# Patient Record
Sex: Female | Born: 1980 | Race: White | Hispanic: No | Marital: Married | Smoking: Never smoker
Health system: Southern US, Community
[De-identification: ages and names within clinical notes are randomized; demographics above are authoritative.]

## PROBLEM LIST (undated history)

## (undated) ENCOUNTER — Inpatient Hospital Stay (HOSPITAL_COMMUNITY): Payer: Self-pay

## (undated) DIAGNOSIS — Z975 Presence of (intrauterine) contraceptive device: Secondary | ICD-10-CM

## (undated) DIAGNOSIS — Z01419 Encounter for gynecological examination (general) (routine) without abnormal findings: Secondary | ICD-10-CM

## (undated) DIAGNOSIS — O3660X0 Maternal care for excessive fetal growth, unspecified trimester, not applicable or unspecified: Secondary | ICD-10-CM

## (undated) DIAGNOSIS — Z8619 Personal history of other infectious and parasitic diseases: Secondary | ICD-10-CM

## (undated) DIAGNOSIS — Z973 Presence of spectacles and contact lenses: Secondary | ICD-10-CM

## (undated) DIAGNOSIS — L52 Erythema nodosum: Secondary | ICD-10-CM

## (undated) DIAGNOSIS — T7840XA Allergy, unspecified, initial encounter: Secondary | ICD-10-CM

## (undated) DIAGNOSIS — G5711 Meralgia paresthetica, right lower limb: Secondary | ICD-10-CM

## (undated) HISTORY — DX: Presence of (intrauterine) contraceptive device: Z97.5

## (undated) HISTORY — DX: Presence of spectacles and contact lenses: Z97.3

## (undated) HISTORY — DX: Allergy, unspecified, initial encounter: T78.40XA

## (undated) HISTORY — DX: Personal history of other infectious and parasitic diseases: Z86.19

## (undated) HISTORY — PX: CYST REMOVAL HAND: SHX6279

## (undated) HISTORY — PX: WISDOM TOOTH EXTRACTION: SHX21

## (undated) HISTORY — DX: Erythema nodosum: L52

## (undated) HISTORY — DX: Meralgia paresthetica, right lower limb: G57.11

## (undated) HISTORY — DX: Encounter for gynecological examination (general) (routine) without abnormal findings: Z01.419

---
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
## 2012-08-20 DIAGNOSIS — Z975 Presence of (intrauterine) contraceptive device: Secondary | ICD-10-CM

## 2012-08-20 HISTORY — DX: Presence of (intrauterine) contraceptive device: Z97.5

## 2012-08-20 NOTE — L&D Delivery Note (Signed)
Delivery Note At 2:31 PM a viable female was delivered via Vaginal, Spontaneous Delivery (Presentation: ; Occiput Anterior).  APGAR: 9, 9; weight .   Placenta status: Intact, Spontaneous.  Cord: 3 vessels with the following complications: None.  Cord pH: not sent Loose nuchal cord X 3 Anesthesia: Epidural  Episiotomy: None Lacerations: bilat periurethral + sec deg>>>rectumk + sphincter intact   Suture Repair: 3.0 vicryl rapide Est. Blood Loss (mL): 500  Mom to postpartum.  Baby to nursery-stable.  Iowa Kappes M 06/12/2013, 3:03 PM

## 2012-10-30 LAB — OB RESULTS CONSOLE HIV ANTIBODY (ROUTINE TESTING): HIV: NONREACTIVE

## 2012-10-30 LAB — OB RESULTS CONSOLE RPR: RPR: NONREACTIVE

## 2012-10-30 LAB — OB RESULTS CONSOLE RUBELLA ANTIBODY, IGM: Rubella: IMMUNE

## 2012-10-30 LAB — OB RESULTS CONSOLE HEPATITIS B SURFACE ANTIGEN: Hepatitis B Surface Ag: NEGATIVE

## 2012-10-30 LAB — OB RESULTS CONSOLE GC/CHLAMYDIA: Chlamydia: NEGATIVE

## 2013-05-02 ENCOUNTER — Inpatient Hospital Stay (HOSPITAL_COMMUNITY)
Admission: AD | Admit: 2013-05-02 | Discharge: 2013-05-02 | Disposition: A | Discharge status: Home / Self Care | Payer: BC Managed Care – PPO | Source: Ambulatory Visit | Attending: Obstetrics and Gynecology | Admitting: Obstetrics and Gynecology

## 2013-05-02 ENCOUNTER — Encounter (HOSPITAL_COMMUNITY): Payer: Self-pay | Admitting: Family

## 2013-05-02 DIAGNOSIS — N949 Unspecified condition associated with female genital organs and menstrual cycle: Secondary | ICD-10-CM

## 2013-05-02 DIAGNOSIS — R109 Unspecified abdominal pain: Secondary | ICD-10-CM | POA: Insufficient documentation

## 2013-05-02 DIAGNOSIS — O9989 Other specified diseases and conditions complicating pregnancy, childbirth and the puerperium: Secondary | ICD-10-CM

## 2013-05-02 DIAGNOSIS — O99891 Other specified diseases and conditions complicating pregnancy: Secondary | ICD-10-CM | POA: Insufficient documentation

## 2013-05-02 LAB — URINALYSIS, ROUTINE W REFLEX MICROSCOPIC
Bilirubin Urine: NEGATIVE
Glucose, UA: NEGATIVE mg/dL
Hgb urine dipstick: NEGATIVE
Ketones, ur: NEGATIVE mg/dL
Protein, ur: NEGATIVE mg/dL
Urobilinogen, UA: 0.2 mg/dL (ref 0.0–1.0)

## 2013-05-02 NOTE — MAU Note (Signed)
Patient presents to MAU with c/o intermittent abdominal pressure x 3 days. Reports feeling  "cervical twinges" since 1030 today. Denies VB, LOF. HSV.

## 2013-05-02 NOTE — MAU Provider Note (Signed)
History     CSN: [id number]  Arrival date and time: 05/02/13 1254   None     Chief Complaint  Patient presents with  . Dysmenorrhea   HPI 32 y.o. G1P0 at [redacted]w[redacted]d with low abd pressure x 3 days, "Cervical twinges" since 1030 am, frequent quick pains "in cervix" this morning, now fewer, still sharp, quick pains. No contractions, bleeding or LOF. + fetal movement. Uncomplicated prenatal course.   History reviewed. No pertinent past medical history.  Past Surgical History  Procedure Laterality Date  . Cyst removal hand      History reviewed. No pertinent family history.  History  Substance Use Topics  . Smoking status: Never Smoker   . Smokeless tobacco: Never Used  . Alcohol Use: No    Allergies:  Allergies  Allergen Reactions  . Other     All birth control, causes skin condition     Prescriptions prior to admission  Medication Sig Dispense Refill  . calcium carbonate (TUMS - DOSED IN MG ELEMENTAL CALCIUM) 500 MG chewable tablet Chew 2 tablets by mouth daily as needed for heartburn.      . Prenatal Vit-Fe Fumarate-FA (PRENATAL MULTIVITAMIN) TABS tablet Take 1 tablet by mouth daily at 12 noon.        Review of Systems  Constitutional: Negative.   Respiratory: Negative.   Cardiovascular: Negative.   Gastrointestinal: Negative for nausea, vomiting, abdominal pain, diarrhea and constipation.  Genitourinary: Negative for dysuria, urgency, frequency, hematuria and flank pain.       Negative for vaginal bleeding, cramping/contractions  Musculoskeletal: Negative.   Neurological: Negative.   Psychiatric/Behavioral: Negative.    Physical Exam   Blood pressure 127/70, pulse 108, temperature 98.7 F (37.1 C), temperature source Oral, resp. rate 18.  Physical Exam  Nursing note and vitals reviewed. Constitutional: She is oriented to person, place, and time. She appears well-developed and well-nourished. No distress.  Cardiovascular: Normal rate.   Respiratory: Effort  normal.  GI: Soft. There is no tenderness.  Genitourinary: No vaginal discharge found.  Dilation: Closed Effacement (%): Thick Cervical Position: Posterior Exam by:: Telford Nab, CNM    Musculoskeletal: Normal range of motion.  Neurological: She is alert and oriented to person, place, and time.  Skin: Skin is warm and dry.  Psychiatric: She has a normal mood and affect.    MAU Course  Procedures Results for orders placed during the hospital encounter of 05/02/13 (from the past 24 hour(s))  URINALYSIS, ROUTINE W REFLEX MICROSCOPIC     Status: None   Collection Time    05/02/13  1:15 PM      Result Value Range   Color, Urine YELLOW  YELLOW   APPearance CLEAR  CLEAR   Specific Gravity, Urine 1.015  1.005 - 1.030   pH 6.0  5.0 - 8.0   Glucose, UA NEGATIVE  NEGATIVE mg/dL   Hgb urine dipstick NEGATIVE  NEGATIVE   Bilirubin Urine NEGATIVE  NEGATIVE   Ketones, ur NEGATIVE  NEGATIVE mg/dL   Protein, ur NEGATIVE  NEGATIVE mg/dL   Urobilinogen, UA 0.2  0.0 - 1.0 mg/dL   Nitrite NEGATIVE  NEGATIVE   Leukocytes, UA NEGATIVE  NEGATIVE   EFM reactive, TOCO: irritability, no UCs  Assessment and Plan   1. Pelvic pain complicating pregnancy, antepartum, third trimester       Medication List         calcium carbonate 500 MG chewable tablet  Commonly known as:  TUMS - dosed in mg  elemental calcium  Chew 2 tablets by mouth daily as needed for heartburn.     prenatal multivitamin Tabs tablet  Take 1 tablet by mouth daily at 12 noon.        Follow-up Information   Follow up with Jeani Hawking, MD. (as scheduled)    Specialty:  Obstetrics and Gynecology   Contact information:   [address] [place] [address] [phone]         [facility] 05/02/2013, 2:08 PM

## 2013-06-10 ENCOUNTER — Inpatient Hospital Stay (HOSPITAL_COMMUNITY): Admission: AD | Admit: 2013-06-10 | Payer: Self-pay | Source: Ambulatory Visit | Admitting: Obstetrics and Gynecology

## 2013-06-11 ENCOUNTER — Telehealth (HOSPITAL_COMMUNITY): Payer: Self-pay | Admitting: *Deleted

## 2013-06-11 ENCOUNTER — Encounter (HOSPITAL_COMMUNITY): Payer: Self-pay | Admitting: *Deleted

## 2013-06-11 LAB — OB RESULTS CONSOLE GBS: GBS: NEGATIVE

## 2013-06-11 NOTE — Telephone Encounter (Signed)
Preadmission screen  

## 2013-06-12 ENCOUNTER — Encounter (HOSPITAL_COMMUNITY): Payer: Self-pay

## 2013-06-12 ENCOUNTER — Inpatient Hospital Stay (HOSPITAL_COMMUNITY)
Admission: RE | Admit: 2013-06-12 | Discharge: 2013-06-14 | DRG: 775 | Disposition: A | Discharge status: Home / Self Care | Payer: BC Managed Care – PPO | Source: Ambulatory Visit | Attending: Obstetrics and Gynecology | Admitting: Obstetrics and Gynecology

## 2013-06-12 ENCOUNTER — Encounter (HOSPITAL_COMMUNITY): Payer: BC Managed Care – PPO | Admitting: Anesthesiology

## 2013-06-12 ENCOUNTER — Inpatient Hospital Stay (HOSPITAL_COMMUNITY): Payer: BC Managed Care – PPO | Admitting: Anesthesiology

## 2013-06-12 LAB — CBC
HCT: 41.6 % (ref 36.0–46.0)
Hemoglobin: 14.3 g/dL (ref 12.0–15.0)
MCH: 31 pg (ref 26.0–34.0)
MCV: 90.2 fL (ref 78.0–100.0)
Platelets: 194 10*3/uL (ref 150–400)
RBC: 4.61 MIL/uL (ref 3.87–5.11)
RDW: 12.9 % (ref 11.5–15.5)
WBC: 9.2 10*3/uL (ref 4.0–10.5)

## 2013-06-12 LAB — RPR: RPR Ser Ql: NONREACTIVE

## 2013-06-12 LAB — TYPE AND SCREEN

## 2013-06-12 LAB — ABO/RH: ABO/RH(D): B POS

## 2013-06-12 MED ORDER — FLEET ENEMA 7-19 GM/118ML RE ENEM: 1.0000 | ENEMA | Freq: Every day | RECTAL | Status: DC | PRN

## 2013-06-12 MED ORDER — DIPHENHYDRAMINE HCL 50 MG/ML IJ SOLN: 12.5000 mg | INTRAMUSCULAR | Status: DC | PRN

## 2013-06-12 MED ORDER — ONDANSETRON HCL 4 MG/2ML IJ SOLN: 4.0000 mg | INTRAMUSCULAR | Status: DC | PRN

## 2013-06-12 MED ORDER — LANOLIN HYDROUS EX OINT: TOPICAL_OINTMENT | CUTANEOUS | Status: DC | PRN

## 2013-06-12 MED ORDER — MEASLES, MUMPS & RUBELLA VAC ~~LOC~~ INJ: 0.5000 mL | INJECTION | Freq: Once | SUBCUTANEOUS | Status: DC

## 2013-06-12 MED ORDER — PHENYLEPHRINE 40 MCG/ML (10ML) SYRINGE FOR IV PUSH (FOR BLOOD PRESSURE SUPPORT)
80.0000 ug | PREFILLED_SYRINGE | INTRAVENOUS | Status: DC | PRN
  Filled 2013-06-12: qty 5

## 2013-06-12 MED ORDER — SENNOSIDES-DOCUSATE SODIUM 8.6-50 MG PO TABS
2.0000 | ORAL_TABLET | ORAL | Status: DC
  Administered 2013-06-13 (×2): 2 via ORAL
  Filled 2013-06-12 (×2): qty 2

## 2013-06-12 MED ORDER — ONDANSETRON HCL 4 MG/2ML IJ SOLN: 4.0000 mg | Freq: Four times a day (QID) | INTRAMUSCULAR | Status: DC | PRN

## 2013-06-12 MED ORDER — BISACODYL 10 MG RE SUPP: 10.0000 mg | Freq: Every day | RECTAL | Status: DC | PRN

## 2013-06-12 MED ORDER — TERBUTALINE SULFATE 1 MG/ML IJ SOLN: 0.2500 mg | Freq: Once | INTRAMUSCULAR | Status: DC | PRN

## 2013-06-12 MED ORDER — FENTANYL 2.5 MCG/ML BUPIVACAINE 1/10 % EPIDURAL INFUSION (WH - ANES)
14.0000 mL/h | INTRAMUSCULAR | Status: DC | PRN
  Administered 2013-06-12: 16 mL/h via EPIDURAL
  Filled 2013-06-12: qty 125

## 2013-06-12 MED ORDER — OXYCODONE-ACETAMINOPHEN 5-325 MG PO TABS: 1.0000 | ORAL_TABLET | ORAL | Status: DC | PRN

## 2013-06-12 MED ORDER — TETANUS-DIPHTH-ACELL PERTUSSIS 5-2.5-18.5 LF-MCG/0.5 IM SUSP: 0.5000 mL | Freq: Once | INTRAMUSCULAR | Status: DC

## 2013-06-12 MED ORDER — LACTATED RINGERS IV SOLN
INTRAVENOUS | Status: DC
  Administered 2013-06-12: 07:00:00 via INTRAVENOUS

## 2013-06-12 MED ORDER — PHENYLEPHRINE 40 MCG/ML (10ML) SYRINGE FOR IV PUSH (FOR BLOOD PRESSURE SUPPORT): 80.0000 ug | PREFILLED_SYRINGE | INTRAVENOUS | Status: DC | PRN

## 2013-06-12 MED ORDER — LIDOCAINE HCL (PF) 1 % IJ SOLN
30.0000 mL | INTRAMUSCULAR | Status: AC | PRN
  Administered 2013-06-12: 30 mL via SUBCUTANEOUS
  Filled 2013-06-12 (×2): qty 30

## 2013-06-12 MED ORDER — SIMETHICONE 80 MG PO CHEW: 80.0000 mg | CHEWABLE_TABLET | ORAL | Status: DC | PRN

## 2013-06-12 MED ORDER — DIPHENHYDRAMINE HCL 25 MG PO CAPS: 25.0000 mg | ORAL_CAPSULE | Freq: Four times a day (QID) | ORAL | Status: DC | PRN

## 2013-06-12 MED ORDER — EPHEDRINE 5 MG/ML INJ: 10.0000 mg | INTRAVENOUS | Status: DC | PRN

## 2013-06-12 MED ORDER — PRENATAL MULTIVITAMIN CH
1.0000 | ORAL_TABLET | Freq: Every day | ORAL | Status: DC
  Administered 2013-06-13: 1 via ORAL
  Filled 2013-06-12: qty 1

## 2013-06-12 MED ORDER — OXYCODONE-ACETAMINOPHEN 5-325 MG PO TABS
1.0000 | ORAL_TABLET | Freq: Four times a day (QID) | ORAL | Status: DC | PRN
  Administered 2013-06-13 – 2013-06-14 (×6): 1 via ORAL
  Filled 2013-06-12 (×6): qty 1

## 2013-06-12 MED ORDER — OXYTOCIN 40 UNITS IN LACTATED RINGERS INFUSION - SIMPLE MED
1.0000 m[IU]/min | INTRAVENOUS | Status: DC
  Administered 2013-06-12: 2 m[IU]/min via INTRAVENOUS
  Filled 2013-06-12: qty 1000

## 2013-06-12 MED ORDER — ZOLPIDEM TARTRATE 5 MG PO TABS: 5.0000 mg | ORAL_TABLET | Freq: Every evening | ORAL | Status: DC | PRN

## 2013-06-12 MED ORDER — OXYTOCIN 40 UNITS IN LACTATED RINGERS INFUSION - SIMPLE MED: 62.5000 mL/h | INTRAVENOUS | Status: DC

## 2013-06-12 MED ORDER — ONDANSETRON HCL 4 MG PO TABS: 4.0000 mg | ORAL_TABLET | ORAL | Status: DC | PRN

## 2013-06-12 MED ORDER — ACETAMINOPHEN 325 MG PO TABS: 650.0000 mg | ORAL_TABLET | ORAL | Status: DC | PRN

## 2013-06-12 MED ORDER — IBUPROFEN 800 MG PO TABS
800.0000 mg | ORAL_TABLET | Freq: Three times a day (TID) | ORAL | Status: DC | PRN
  Administered 2013-06-12 – 2013-06-14 (×6): 800 mg via ORAL
  Filled 2013-06-12 (×6): qty 1

## 2013-06-12 MED ORDER — EPHEDRINE 5 MG/ML INJ
10.0000 mg | INTRAVENOUS | Status: DC | PRN
  Filled 2013-06-12: qty 4

## 2013-06-12 MED ORDER — CITRIC ACID-SODIUM CITRATE 334-500 MG/5ML PO SOLN: 30.0000 mL | ORAL | Status: DC | PRN

## 2013-06-12 MED ORDER — DIBUCAINE 1 % RE OINT: 1.0000 "application " | TOPICAL_OINTMENT | RECTAL | Status: DC | PRN

## 2013-06-12 MED ORDER — LACTATED RINGERS IV SOLN: 500.0000 mL | Freq: Once | INTRAVENOUS | Status: DC

## 2013-06-12 MED ORDER — LIDOCAINE HCL (PF) 1 % IJ SOLN
INTRAMUSCULAR | Status: DC | PRN
  Administered 2013-06-12 (×4): 4 mL

## 2013-06-12 MED ORDER — IBUPROFEN 600 MG PO TABS
600.0000 mg | ORAL_TABLET | Freq: Four times a day (QID) | ORAL | Status: DC | PRN
Start: 2013-06-12 — End: 2013-06-12

## 2013-06-12 MED ORDER — BENZOCAINE-MENTHOL 20-0.5 % EX AERO
1.0000 "application " | INHALATION_SPRAY | CUTANEOUS | Status: DC | PRN
  Filled 2013-06-12 (×2): qty 56

## 2013-06-12 MED ORDER — LACTATED RINGERS IV SOLN
500.0000 mL | INTRAVENOUS | Status: DC | PRN
Start: 2013-06-12 — End: 2013-06-12

## 2013-06-12 MED ORDER — OXYTOCIN BOLUS FROM INFUSION: 500.0000 mL | INTRAVENOUS | Status: DC

## 2013-06-12 MED ORDER — WITCH HAZEL-GLYCERIN EX PADS: 1.0000 "application " | MEDICATED_PAD | CUTANEOUS | Status: DC | PRN

## 2013-06-12 NOTE — Lactation Note (Signed)
This note was copied from the chart of Brandi Jennifermarie Franzen. Lactation Consultation Note Initial [facility] LC visit with resources given and discussed.  MBU RN provided a #20 nipple shield with poor latch.  Instructions given with demonstration of applying nipple shield.  Encouraged MBU RN to use a #24 if needed after baby latches according to nipple size.  Encouraged hand expression with demonstration and visible colostrum.  Mom uses a hand pump to help make nipple erect, assured her she will normally not see much colostrum, but to help in latching baby.  Out pt appointment encouraged if she continues to use nipple shield.  Baby remains sleepy, encouraged to try with skin to skin and feed with cues.  She will call for assistance PRN.  Patient Name: Brandi Henderson [id number] Date: 06/12/2013 Reason for consult: Difficult latch;Initial assessment   Maternal Data Has patient been taught Hand Expression?: Yes Does the patient have breastfeeding experience prior to this delivery?: No  Feeding Feeding Type: Breast Fed Length of feed: 10 min  LATCH Score/Interventions Latch: Grasps breast easily, tongue down, lips flanged, rhythmical sucking.  Audible Swallowing: None  Type of Nipple: Everted at rest and after stimulation (semi)  Comfort (Breast/Nipple): Soft / non-tender     Hold (Positioning): Assistance needed to correctly position infant at breast and maintain latch. Intervention(s):  (breast shield)  LATCH Score: 7  Lactation Tools Discussed/Used     Consult Status Consult Status: Follow-up Date: 06/13/13 Follow-up type: In-patient    Brandi Henderson 06/12/2013, 11:12 PM

## 2013-06-12 NOTE — H&P (Signed)
Brandi Henderson is a 32 y.o. female presenting for IOL. Maternal Medical History:  Fetal activity: Perceived fetal activity is normal.      OB History   Grav Para Term Preterm Abortions TAB SAB Ect Mult Living   2 0   1 1         Past Medical History  Diagnosis Date  . Hx of varicella    Past Surgical History  Procedure Laterality Date  . Cyst removal hand     Family History: family history includes Cancer in her maternal grandfather; Crohn's disease in her brother; Diabetes in her paternal grandmother. Social History:  reports that she has never smoked. She has never used smokeless tobacco. She reports that she does not drink alcohol or use illicit drugs.   Prenatal Transfer Tool  Maternal Diabetes: No Genetic Screening: Normal Maternal Ultrasounds/Referrals: Normal Fetal Ultrasounds or other Referrals:  None Maternal Substance Abuse:  No Significant Maternal Medications:  None Significant Maternal Lab Results:  None Other Comments:  None  ROS    Blood pressure 136/73, temperature 97.7 F (36.5 C), temperature source Oral, resp. rate 18, height 6' (1.829 m), weight 266 lb (120.657 kg). Maternal Exam:  Abdomen: Patient reports no abdominal tenderness. Fundal height is term FH.   Estimated fetal weight is AGA.   Fetal presentation: vertex  Introitus: Normal vulva. Normal vagina.  Amniotic fluid character: clear.  Pelvis: adequate for delivery.   Cervix: Cervix evaluated by digital exam.     Physical Exam  Constitutional: She is oriented to person, place, and time. She appears well-developed and well-nourished.  Neck: Normal range of motion. Neck supple.  Cardiovascular: Normal rate and regular rhythm.   Respiratory: Effort normal and breath sounds normal.  GI:  Term FH/FHR 148  Genitourinary:  22/50/-2  AROM>>>clr  Musculoskeletal: Normal range of motion.  Neurological: She is alert and oriented to person, place, and time.    Prenatal labs: ABO, Rh:  B/Positive/-- (03/13 0000) Antibody: Negative (03/13 0000) Rubella: Immune (03/13 0000) RPR: Nonreactive (03/13 0000)  HBsAg: Negative (03/13 0000)  HIV: Non-reactive (03/13 0000)  GBS: Negative (10/23 0000)   Assessment/Plan: [redacted]w[redacted]d For IOL   Aurie Harroun M 06/12/2013, 7:45 AM

## 2013-06-12 NOTE — Anesthesia Procedure Notes (Signed)
Epidural Patient location during procedure: OB Start time: 06/12/2013 10:27 AM  Staffing Performed by: anesthesiologist   Preanesthetic Checklist Completed: patient identified, site marked, surgical consent, pre-op evaluation, timeout performed, IV checked, risks and benefits discussed and monitors and equipment checked  Epidural Patient position: sitting Prep: site prepped and draped and DuraPrep Patient monitoring: continuous pulse ox and blood pressure Approach: midline Injection technique: LOR air  Needle:  Needle type: Tuohy  Needle gauge: 17 G Needle length: 9 cm and 9 Needle insertion depth: 7 cm Catheter type: closed end flexible Catheter size: 19 Gauge Catheter at skin depth: 12 cm Test dose: negative  Assessment Events: blood not aspirated, injection not painful, no injection resistance, negative IV test and no paresthesia  Additional Notes Discussed risk of headache, infection, bleeding, nerve injury and failed or incomplete block.  Patient voices understanding and wishes to proceed. Epidural placed easily on first attempt.  No paresthesia.  Patient tolerated procedure well with no apparent complications.  Jasmine December, MD Reason for block:procedure for pain

## 2013-06-12 NOTE — Anesthesia Preprocedure Evaluation (Signed)

## 2013-06-13 LAB — CBC
MCH: 31.8 pg (ref 26.0–34.0)
MCV: 91.1 fL (ref 78.0–100.0)
Platelets: 159 10*3/uL (ref 150–400)
RBC: 3.49 MIL/uL — ABNORMAL LOW (ref 3.87–5.11)
RDW: 13 % (ref 11.5–15.5)
WBC: 10 10*3/uL (ref 4.0–10.5)

## 2013-06-13 NOTE — Progress Notes (Signed)
Post Partum Day 1 Subjective: no complaints  Objective: Blood pressure 132/75, pulse 114, temperature 98.5 F (36.9 C), temperature source Oral, resp. rate 20, height 6' (1.829 m), weight 266 lb (120.657 kg), SpO2 100.00%, unknown if currently breastfeeding.  Physical Exam:  General: alert Lochia: appropriate Uterine Fundus: firm Incision: healing well DVT Evaluation: No evidence of DVT seen on physical exam.   Recent Labs  06/12/13 0950 06/13/13 0555  HGB 14.3 11.1*  HCT 41.6 31.8*    Assessment/Plan: Plan for discharge tomorrow   LOS: 1 day   Orlena Garmon M 06/13/2013, 8:24 AM

## 2013-06-13 NOTE — Anesthesia Postprocedure Evaluation (Signed)
Anesthesia Post Note  Patient: Brandi Henderson  Procedure(s) Performed: * No procedures listed *  Anesthesia type: Epidural  Patient location: Mother/Baby  Post pain: Pain level controlled  Post assessment: Post-op Vital signs reviewed  Last Vitals: BP 132/75  Pulse 114  Temp(Src) 36.9 C (Oral)  Resp 20  Ht 6' (1.829 m)  Wt 266 lb (120.657 kg)  BMI 36.07 kg/m2  SpO2 100%  Post vital signs: Reviewed  Level of consciousness: awake  Complications: No apparent anesthesia complications

## 2013-06-14 MED ORDER — OXYCODONE-ACETAMINOPHEN 5-325 MG PO TABS: 1.0000 | ORAL_TABLET | Freq: Four times a day (QID) | ORAL | Status: DC | PRN

## 2013-06-14 MED ORDER — IBUPROFEN 800 MG PO TABS: 800.0000 mg | ORAL_TABLET | Freq: Three times a day (TID) | ORAL | Status: DC | PRN

## 2013-06-14 NOTE — Discharge Summary (Signed)
Obstetric Discharge Summary Reason for Admission: induction of labor Prenatal Procedures: none Intrapartum Procedures: spontaneous vaginal delivery Postpartum Procedures: none Complications-Operative and Postpartum: none Hemoglobin  Date Value Range Status  06/13/2013 11.1* 12.0 - 15.0 g/dL Final     REPEATED TO VERIFY     DELTA CHECK NOTED     HCT  Date Value Range Status  06/13/2013 31.8* 36.0 - 46.0 % Final    Physical Exam:  General: alert Lochia: appropriate Uterine Fundus: firm Incision: healing well DVT Evaluation: No evidence of DVT seen on physical exam.  Discharge Diagnoses: Term Pregnancy-delivered  Discharge Information: Date: 06/14/2013 Activity: pelvic rest Diet: routine Medications: PNV, Ibuprofen and Percocet Condition: stable Instructions: refer to practice specific booklet Discharge to: home Follow-up Information   Follow up with Meriel Pica, MD. Schedule an appointment as soon as possible for a visit in 6 weeks.   Specialty:  Obstetrics and Gynecology   Contact information:   [address] [place] [address] [phone]       Newborn Data: Live born female  Birth Weight: 8 lb 3 oz (3714 g) APGAR: 9, 9  Home with mother.  Meriel Pica 06/14/2013, 11:38 AM

## 2013-08-20 DIAGNOSIS — G5711 Meralgia paresthetica, right lower limb: Secondary | ICD-10-CM

## 2013-08-20 HISTORY — DX: Meralgia paresthetica, right lower limb: G57.11

## 2014-03-23 ENCOUNTER — Other Ambulatory Visit: Payer: Self-pay | Admitting: Family Medicine

## 2014-03-23 ENCOUNTER — Encounter: Payer: Self-pay | Admitting: Medical

## 2014-03-23 ENCOUNTER — Telehealth: Payer: Self-pay | Admitting: Medical

## 2014-03-23 ENCOUNTER — Ambulatory Visit (INDEPENDENT_AMBULATORY_CARE_PROVIDER_SITE_OTHER): Payer: 59 | Admitting: Medical

## 2014-03-23 VITALS — BP 100/78 | HR 64 | Temp 98.1°F | Resp 16 | Ht 72.0 in | Wt 198.0 lb

## 2014-03-23 DIAGNOSIS — G5711 Meralgia paresthetica, right lower limb: Secondary | ICD-10-CM

## 2014-03-23 DIAGNOSIS — Z0000 Encounter for general adult medical examination without abnormal findings: Secondary | ICD-10-CM

## 2014-03-23 DIAGNOSIS — G5710 Meralgia paresthetica, unspecified lower limb: Secondary | ICD-10-CM

## 2014-03-23 LAB — COMPREHENSIVE METABOLIC PANEL
ALBUMIN: 4.4 g/dL (ref 3.5–5.2)
ALT: 15 U/L (ref 0–35)
AST: 18 U/L (ref 0–37)
Alkaline Phosphatase: 93 U/L (ref 39–117)
BUN: 10 mg/dL (ref 6–23)
CHLORIDE: 103 meq/L (ref 96–112)
CO2: 25 meq/L (ref 19–32)
CREATININE: 0.66 mg/dL (ref 0.50–1.10)
Calcium: 9.2 mg/dL (ref 8.4–10.5)
GLUCOSE: 91 mg/dL (ref 70–99)
POTASSIUM: 4.6 meq/L (ref 3.5–5.3)
Sodium: 138 mEq/L (ref 135–145)
Total Bilirubin: 0.8 mg/dL (ref 0.2–1.2)
Total Protein: 6.6 g/dL (ref 6.0–8.3)

## 2014-03-23 LAB — CBC WITH DIFFERENTIAL/PLATELET
Basophils Absolute: 0 10*3/uL (ref 0.0–0.1)
Basophils Relative: 0 % (ref 0–1)
EOS PCT: 2 % (ref 0–5)
Eosinophils Absolute: 0.1 10*3/uL (ref 0.0–0.7)
HEMATOCRIT: 43.6 % (ref 36.0–46.0)
HEMOGLOBIN: 14.7 g/dL (ref 12.0–15.0)
LYMPHS ABS: 1.4 10*3/uL (ref 0.7–4.0)
LYMPHS PCT: 29 % (ref 12–46)
MCH: 30.8 pg (ref 26.0–34.0)
MCHC: 33.7 g/dL (ref 30.0–36.0)
MCV: 91.4 fL (ref 78.0–100.0)
MONO ABS: 0.5 10*3/uL (ref 0.1–1.0)
MONOS PCT: 11 % (ref 3–12)
Neutro Abs: 2.8 10*3/uL (ref 1.7–7.7)
Neutrophils Relative %: 58 % (ref 43–77)
Platelets: 249 10*3/uL (ref 150–400)
RBC: 4.77 MIL/uL (ref 3.87–5.11)
RDW: 12.4 % (ref 11.5–15.5)
WBC: 4.8 10*3/uL (ref 4.0–10.5)

## 2014-03-23 LAB — LIPID PANEL
Cholesterol: 129 mg/dL (ref 0–200)
HDL: 49 mg/dL (ref >39)
LDL CALC: 69 mg/dL (ref 0–99)
Total CHOL/HDL Ratio: 2.6 Ratio
Triglycerides: 55 mg/dL (ref <150)
VLDL: 11 mg/dL (ref 0–40)

## 2014-03-23 LAB — POCT URINALYSIS DIPSTICK
BILIRUBIN UA: NEGATIVE
Blood, UA: NEGATIVE
Glucose, UA: NEGATIVE
KETONES UA: NEGATIVE
Leukocytes, UA: NEGATIVE
Nitrite, UA: NEGATIVE
Protein, UA: NEGATIVE
Urobilinogen, UA: NEGATIVE
pH, UA: 6

## 2014-03-23 LAB — TSH: TSH: 1.398 u[IU]/mL (ref 0.350–4.500)

## 2014-03-23 NOTE — Telephone Encounter (Signed)
I fax over her information and the orders are in EPIC as well. CLS [facility]. [place]

## 2014-03-23 NOTE — Patient Instructions (Signed)
  Thank you for giving me the opportunity to serve you today.    Your diagnosis today includes: Encounter Diagnosis  Name Primary?  . Routine general medical examination at a health care facility Yes     Specific recommendations today include:    Return pending labs.    I have included other useful information below for your review.

## 2014-03-23 NOTE — Telephone Encounter (Signed)
Refer to physical therapy for Meralgia Paraesthetica due to pregnancy, numbness x 1 year.  Check with [facility] first to make sure they deal with this type of issue.

## 2014-03-23 NOTE — Progress Notes (Signed)
Subjective:   HPI  Brandi Henderson is a 33 y.o. female who presents for a complete physical.  New patient today.  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   Preventative care: Last ophthalmology visit: yes [facility] last eye exam 7/ 2015 Last dental visit: yes Dr. Beryl Meager Last colonoscopy:n/a Last mammogram:n/a Last gynecological exam:09/2012, Dr. Orvan Seen, [facility], gynecology Last EKG:n/a Last labs:new  Prior vaccinations: TD or Tdap:02/2013 Influenza:yes Pneumococcal:n/a Shingles/Zostavax:n/a  Advanced directive:n/a Health care power of attorney:n/a Living will:n/a  Concerns: Numbness of right thigh that started at the beginning of the third trimester that hasn't seemed to go away.  Constant numbness in the right lateral upper thigh for months now.   Not taking anything for this.  No prior similar.    Is nursing, has 32mo son.   Using prenatal vitamin and DHA supplement for this.    Reviewed their medical, surgical, family, socia l, medication, and allergy history and updated chart as appropriate.  Past Medical History  Diagnosis Date  . Hx of varicella   . Allergy   . Wears glasses   . Erythema nodosum 2001    with OCPs and Mononucleosis prior  . IUD (intrauterine device) in place 2014    copper   . Routine gynecological examination     Dr. Orvan Seen, [facility]    Past Surgical History  Procedure Laterality Date  . Cyst removal hand      ganglion, right  . Wisdom tooth extraction      History   Social History  . Marital Status: Married    Spouse Name: N/A    Number of Children: N/A  . Years of Education: N/A   Occupational History  . Not on file.   Social History Main Topics  . Smoking status: Never Smoker   . Smokeless tobacco: Never Used  . Alcohol Use: 1.2 oz/week    1 Cans of beer, 1 Glasses of wine per week  . Drug Use: No  . Sexual Activity: Yes    Birth Control/ Protection: None   Other  Topics Concern  . Not on file   Social History Narrative   Exercises 4 days per week with walking, running, biking.  Married, has 56mo son as of 8/15.   Moved from [place], [place].  Teaches history at [employer]    Family History  Problem Relation Age of Onset  . Crohn's disease Brother   . Cancer Maternal Grandfather   . Diabetes Paternal Grandmother   . Heart disease Paternal Grandmother   . Macular degeneration Paternal Grandmother   . Macular degeneration Father   . Stroke Neg Hx   . Hypertension Neg Hx   . Hyperlipidemia Neg Hx     Current outpatient prescriptions:calcium-vitamin D (OSCAL WITH D) 500-200 MG-UNIT per tablet, Take 1 tablet by mouth., Disp: , Rfl: ;  Multiple Vitamins-Minerals (MULTIVITAMIN WITH MINERALS) tablet, Take 1 tablet by mouth daily., Disp: , Rfl:   Allergies  Allergen Reactions  . Estrogens     Birth control pills, erythema nodosum    Review of Systems Constitutional: -fever, -chills, -sweats, -unexpected weight change, -decreased appetite, -fatigue Allergy: -sneezing, -itching, -congestion Dermatology: -changing moles, --rash, -lumps ENT: -runny nose, -ear pain, -sore throat, -hoarseness, -sinus pain, -teeth pain, - ringing in ears, -hearing loss, -nosebleeds Cardiology: -chest pain, -palpitations, -swelling, -difficulty breathing when lying flat, -waking up short of breath Respiratory: -cough, -shortness of breath, -difficulty breathing with exercise  or exertion, -wheezing, -coughing up blood Gastroenterology: -abdominal pain, -nausea, -vomiting, -diarrhea, -constipation, -blood in stool, -changes in bowel movement, -difficulty swallowing or eating Hematology: -bleeding, -bruising  Musculoskeletal: -joint aches, -muscle aches, -joint swelling, -back pain, -neck pain, -cramping, -changes in gait Ophthalmology: denies vision changes, eye redness, itching, discharge Urology: -burning with urination, -difficulty urinating, -blood in urine, -urinary  frequency, -urgency, -incontinence Neurology: -headache, -weakness, -tingling, +numbness, -memory loss, -falls, -dizziness Psychology: -depressed mood, -agitation, -sleep problems     Objective:   Physical Exam  BP 100/78  Pulse 64  Temp(Src) 98.1 F (36.7 C) (Oral)  Resp 16  Ht 6' (1.829 m)  Wt 198 lb (89.812 kg)  BMI 26.85 kg/m2  LMP 02/20/2014  General appearance: alert, no distress, WD/WN, white female Skin: few scattered benign appearing macules, no worrisome lesions HEENT: normocephalic, conjunctiva/corneas normal, sclerae anicteric, PERRLA, EOMi, nares patent, no discharge or erythema, pharynx normal Oral cavity: MMM, tongue normal, teeth in good repair Neck: supple, no lymphadenopathy, no thyromegaly, no masses, normal ROM, no bruits Chest: non tender, normal shape and expansion Heart: RRR, normal S1, S2, no murmurs Lungs: CTA bilaterally, no wheezes, rhonchi, or rales Abdomen: +bs, soft, non tender, non distended, no masses, no hepatomegaly, no splenomegaly, no bruits Back: non tender, normal ROM, no scoliosis Musculoskeletal: upper extremities non tender, no obvious deformity, normal ROM throughout, lower extremities non tender, no obvious deformity, normal ROM throughout Extremities: bilat medial proximal lower legs with few varicosities, but not widespread, no edema, no cyanosis, no clubbing Pulses: 2+ symmetric, upper and lower extremities, normal cap refill Neurological: alert, oriented x 3, CN2-12 intact, strength normal upper extremities and lower extremities, sensation normal throughout, DTRs 2+ throughout, no cerebellar signs, gait normal Psychiatric: normal affect, behavior normal, pleasant  Breast/gyn/rectal - deferred    Assessment and Plan :    Encounter Diagnoses  Name Primary?  . Routine general medical examination at a health care facility Yes  . Meralgia paresthetica of right side   . Patient is a currently breast-feeding mother     Physical  exam - discussed healthy lifestyle, diet, exercise, preventative care, vaccinations, and addressed their concerns.  Handout given.  Meralgia paresthetic - discussed diagnosis, symptoms, causes, treatment. Referral to physical therapy  Current breast feeding, taking prenatal vitamin  Follow-up pending labs, referral

## 2014-04-21 ENCOUNTER — Ambulatory Visit: Payer: 59 | Attending: Medical

## 2014-04-21 DIAGNOSIS — IMO0001 Reserved for inherently not codable concepts without codable children: Secondary | ICD-10-CM | POA: Insufficient documentation

## 2014-04-21 DIAGNOSIS — G5710 Meralgia paresthetica, unspecified lower limb: Secondary | ICD-10-CM | POA: Insufficient documentation

## 2014-04-21 DIAGNOSIS — R209 Unspecified disturbances of skin sensation: Secondary | ICD-10-CM | POA: Insufficient documentation

## 2014-04-28 ENCOUNTER — Ambulatory Visit: Payer: 59 | Admitting: Physical Therapy

## 2014-04-28 DIAGNOSIS — IMO0001 Reserved for inherently not codable concepts without codable children: Secondary | ICD-10-CM | POA: Diagnosis not present

## 2014-05-05 ENCOUNTER — Ambulatory Visit: Payer: 59 | Admitting: Physical Therapy

## 2014-05-05 DIAGNOSIS — IMO0001 Reserved for inherently not codable concepts without codable children: Secondary | ICD-10-CM | POA: Diagnosis not present

## 2014-05-12 ENCOUNTER — Ambulatory Visit: Payer: 59 | Admitting: Physical Therapy

## 2014-05-12 DIAGNOSIS — IMO0001 Reserved for inherently not codable concepts without codable children: Secondary | ICD-10-CM | POA: Diagnosis not present

## 2014-05-19 ENCOUNTER — Ambulatory Visit: Payer: 59 | Admitting: Physical Therapy

## 2014-05-19 DIAGNOSIS — IMO0001 Reserved for inherently not codable concepts without codable children: Secondary | ICD-10-CM | POA: Diagnosis not present

## 2014-06-19 ENCOUNTER — Encounter: Payer: Self-pay | Admitting: Internal Medicine

## 2014-06-21 ENCOUNTER — Encounter: Payer: Self-pay | Admitting: Medical

## 2014-08-11 ENCOUNTER — Ambulatory Visit (INDEPENDENT_AMBULATORY_CARE_PROVIDER_SITE_OTHER): Payer: 59 | Admitting: Family Medicine

## 2014-08-11 ENCOUNTER — Encounter: Payer: Self-pay | Admitting: Family Medicine

## 2014-08-11 VITALS — BP 120/76 | HR 66 | Wt 194.0 lb

## 2014-08-11 DIAGNOSIS — L259 Unspecified contact dermatitis, unspecified cause: Secondary | ICD-10-CM

## 2014-08-11 MED ORDER — TRIAMCINOLONE ACETONIDE 0.1 % EX CREA: 1.0000 "application " | TOPICAL_CREAM | Freq: Two times a day (BID) | CUTANEOUS | Status: DC

## 2014-08-11 NOTE — Progress Notes (Signed)
   Subjective:    Patient ID: Brandi Henderson, female    DOB: 05-28-1981, 33 y.o.   MRN: [id number]  HPI She is here for evaluation of a rash present on the right eyelid. She has similar episode in early December but it did go away. This has recurred within the last several days. She has not used any new soaps, detergents, mascara. She cannot relate this to anything in particular.   Review of Systems     Objective:   Physical Exam The right lower eyelid is erythematous however there is some clearing around the eyelashes. Margins are well demarcated. She has erythema and swelling to a lesser stent on the upper eyelid. Is also still a slight amount of erythema on the lower left eyelid. Cornea and conjunctiva are normal bilaterally       Assessment & Plan:  Contact dermatitis - Plan: triamcinolone cream (KENALOG) 0.1 %  I explained that I did not know what this was coming from but it appeared more inflammatory than truly infectious. If the steroid cream does not work, dermatology referral will be made.

## 2014-08-11 NOTE — Patient Instructions (Signed)
Use the steroid sparingly 2 or 3 times per day as well as cool compresses

## 2014-11-19 ENCOUNTER — Ambulatory Visit (INDEPENDENT_AMBULATORY_CARE_PROVIDER_SITE_OTHER): Payer: 59 | Admitting: Family Medicine

## 2014-11-19 ENCOUNTER — Encounter: Payer: Self-pay | Admitting: Family Medicine

## 2014-11-19 VITALS — BP 124/80 | HR 70 | Temp 98.1°F

## 2014-11-19 DIAGNOSIS — J029 Acute pharyngitis, unspecified: Secondary | ICD-10-CM

## 2014-11-19 DIAGNOSIS — J020 Streptococcal pharyngitis: Secondary | ICD-10-CM

## 2014-11-19 LAB — POCT RAPID STREP A (OFFICE): Rapid Strep A Screen: POSITIVE — AB

## 2014-11-19 MED ORDER — AMOXICILLIN 875 MG PO TABS
875.0000 mg | ORAL_TABLET | Freq: Two times a day (BID) | ORAL | Status: DC
Start: 2014-11-19 — End: 2015-04-04

## 2014-11-19 NOTE — Progress Notes (Signed)
   Subjective:    Patient ID: Brandi Henderson, female    DOB: [birth year], 34 y.o.   MRN: [id number]  HPI She complains of a five-day history this started with sore throat and malaise but no fever, chills, earache and only slight cough.   Review of Systems     Objective:   Physical Exam Alert and in no distress. Tympanic membranes and canals are normal. Pharyngeal area is slightly red Neck is supple without adenopathy or thyromegaly. Cardiac exam shows a regular sinus rhythm without murmurs or gallops. Lungs are clear to auscultation.  Strep screen is positive      Assessment & Plan:  Sore throat - Plan: POCT rapid strep A  Strep pharyngitis

## 2014-12-27 ENCOUNTER — Telehealth: Payer: Self-pay | Admitting: Family Medicine

## 2014-12-27 DIAGNOSIS — L259 Unspecified contact dermatitis, unspecified cause: Secondary | ICD-10-CM

## 2014-12-27 MED ORDER — TRIAMCINOLONE ACETONIDE 0.1 % EX CREA: 1.0000 "application " | TOPICAL_CREAM | Freq: Two times a day (BID) | CUTANEOUS | Status: DC

## 2014-12-27 NOTE — Telephone Encounter (Signed)
Called patient to let her know a Rx was called in.

## 2014-12-27 NOTE — Telephone Encounter (Signed)
Pt is having the same allergic reaction to her eye that you saw her for in December and wants to know if you will call in the same cream.  She has a new pharmacy [facility] on [address].

## 2014-12-27 NOTE — Telephone Encounter (Signed)
Let her know that I called some medication in

## 2015-04-04 ENCOUNTER — Other Ambulatory Visit: Payer: Self-pay

## 2015-04-04 ENCOUNTER — Ambulatory Visit (INDEPENDENT_AMBULATORY_CARE_PROVIDER_SITE_OTHER): Payer: 59 | Admitting: Family Medicine

## 2015-04-04 ENCOUNTER — Encounter: Payer: Self-pay | Admitting: Family Medicine

## 2015-04-04 VITALS — BP 136/90 | HR 70 | Wt 195.0 lb

## 2015-04-04 DIAGNOSIS — M62830 Muscle spasm of back: Secondary | ICD-10-CM

## 2015-04-04 MED ORDER — CARISOPRODOL 350 MG PO TABS: ORAL_TABLET | ORAL | Status: DC

## 2015-04-04 NOTE — Progress Notes (Signed)
   Subjective:    Patient ID: Brandi Henderson, female    DOB: May 14, 1981, 34 y.o.   MRN: [id number]  HPI Earlier todayshe had the onset of back pain while lifting up her toddler. The pain is gotten worse she has had one other episode of this. No numbness or tingling down her leg. Apparently it is getting worse.  Review of Systems     Objective:   Physical Exam Alert and in no distress. She does complain of tenderness to palpation of the paravertebral muscles but no spasm is noted. Limitation of forward flexion. Negative straight leg raising. Normal hip motion.       Assessment & Plan:  Back muscle spasm - Plan: carisoprodol (SOMA) 350 MG tablet Recommend heat and gentle stretching as well as 800 g ibuprofen 3 times per day. Also recommend use of soma. Recommend this mainly at night. Discussed proper lifting technique with her.

## 2015-04-04 NOTE — Patient Instructions (Addendum)
800 mg of ibuprofen 3 times per day. Use a muscle relaxer as needed Heat  for 20 minutes and then gentle stretching after that Proper posturing is also helpful. Back nice and straight

## 2015-08-21 NOTE — L&D Delivery Note (Signed)
Delivery Note At  a viable and healthy female was delivered via  (Presentation:OA;  ).  APGAR: 9, 9; weight-;pending   .   Placenta status: complete, spontaneous, .  Cord: normal, 3 vessel,  With no complications: .  Cord pH: N/a  Anesthesia:  Epidural  Episiotomy:  None Lacerations:  1st degree perineal laceration and right labial very small tear Suture Repair: 3.0 vicryl rapide Est. Blood Loss (mL):  300 cc  Mom to postpartum.  Baby to Couplet care / Skin to Skin. Cord blood obtained for donation  Arbutus Nelligan R 03/13/2016, 5:34 PM

## 2015-10-26 LAB — OB RESULTS CONSOLE HIV ANTIBODY (ROUTINE TESTING): HIV: NONREACTIVE

## 2015-10-26 LAB — OB RESULTS CONSOLE ABO/RH: RH TYPE: POSITIVE

## 2015-10-26 LAB — OB RESULTS CONSOLE RUBELLA ANTIBODY, IGM: Rubella: IMMUNE

## 2015-10-26 LAB — OB RESULTS CONSOLE ANTIBODY SCREEN: Antibody Screen: NEGATIVE

## 2015-10-26 LAB — OB RESULTS CONSOLE GC/CHLAMYDIA
Chlamydia: NEGATIVE
GC PROBE AMP, GENITAL: NEGATIVE

## 2015-10-26 LAB — OB RESULTS CONSOLE HEPATITIS B SURFACE ANTIGEN: HEP B S AG: NEGATIVE

## 2015-10-26 LAB — OB RESULTS CONSOLE RPR: RPR: NONREACTIVE

## 2016-02-23 LAB — OB RESULTS CONSOLE GBS: GBS: NEGATIVE

## 2016-03-07 ENCOUNTER — Telehealth (HOSPITAL_COMMUNITY): Payer: Self-pay | Admitting: *Deleted

## 2016-03-07 ENCOUNTER — Other Ambulatory Visit: Payer: Self-pay | Admitting: Obstetrics & Gynecology

## 2016-03-07 ENCOUNTER — Encounter (HOSPITAL_COMMUNITY): Payer: Self-pay | Admitting: *Deleted

## 2016-03-07 NOTE — Telephone Encounter (Signed)
Preadmission screen  

## 2016-03-13 ENCOUNTER — Inpatient Hospital Stay (HOSPITAL_COMMUNITY)
Admission: RE | Admit: 2016-03-13 | Discharge: 2016-03-14 | DRG: 775 | Disposition: A | Discharge status: Home / Self Care | Payer: BLUE CROSS/BLUE SHIELD | Source: Ambulatory Visit | Attending: Obstetrics & Gynecology | Admitting: Obstetrics & Gynecology

## 2016-03-13 ENCOUNTER — Encounter (HOSPITAL_COMMUNITY): Payer: Self-pay

## 2016-03-13 ENCOUNTER — Inpatient Hospital Stay (HOSPITAL_COMMUNITY): Payer: BLUE CROSS/BLUE SHIELD | Admitting: Anesthesiology

## 2016-03-13 DIAGNOSIS — O3663X0 Maternal care for excessive fetal growth, third trimester, not applicable or unspecified: Principal | ICD-10-CM | POA: Diagnosis present

## 2016-03-13 DIAGNOSIS — Z833 Family history of diabetes mellitus: Secondary | ICD-10-CM | POA: Diagnosis not present

## 2016-03-13 DIAGNOSIS — O3660X0 Maternal care for excessive fetal growth, unspecified trimester, not applicable or unspecified: Secondary | ICD-10-CM | POA: Diagnosis present

## 2016-03-13 DIAGNOSIS — Z3A37 37 weeks gestation of pregnancy: Secondary | ICD-10-CM | POA: Diagnosis not present

## 2016-03-13 DIAGNOSIS — Z8249 Family history of ischemic heart disease and other diseases of the circulatory system: Secondary | ICD-10-CM | POA: Diagnosis not present

## 2016-03-13 DIAGNOSIS — O2492 Unspecified diabetes mellitus in childbirth: Secondary | ICD-10-CM | POA: Diagnosis present

## 2016-03-13 DIAGNOSIS — Z3490 Encounter for supervision of normal pregnancy, unspecified, unspecified trimester: Secondary | ICD-10-CM

## 2016-03-13 DIAGNOSIS — Z809 Family history of malignant neoplasm, unspecified: Secondary | ICD-10-CM | POA: Diagnosis not present

## 2016-03-13 DIAGNOSIS — Z3A39 39 weeks gestation of pregnancy: Secondary | ICD-10-CM

## 2016-03-13 HISTORY — DX: Maternal care for excessive fetal growth, unspecified trimester, not applicable or unspecified: O36.60X0

## 2016-03-13 LAB — CBC
HCT: 39.8 % (ref 36.0–46.0)
Hemoglobin: 13.6 g/dL (ref 12.0–15.0)
MCH: 31.1 pg (ref 26.0–34.0)
MCHC: 34.2 g/dL (ref 30.0–36.0)
MCV: 91.1 fL (ref 78.0–100.0)
PLATELETS: 204 10*3/uL (ref 150–400)
RBC: 4.37 MIL/uL (ref 3.87–5.11)
RDW: 13.3 % (ref 11.5–15.5)
WBC: 8.4 10*3/uL (ref 4.0–10.5)

## 2016-03-13 LAB — TYPE AND SCREEN
ABO/RH(D): B POS
ANTIBODY SCREEN: NEGATIVE

## 2016-03-13 LAB — RPR: RPR: NONREACTIVE

## 2016-03-13 MED ORDER — DIPHENHYDRAMINE HCL 50 MG/ML IJ SOLN: 12.5000 mg | INTRAMUSCULAR | Status: DC | PRN

## 2016-03-13 MED ORDER — EPHEDRINE 5 MG/ML INJ
10.0000 mg | INTRAVENOUS | Status: DC | PRN
  Filled 2016-03-13: qty 4

## 2016-03-13 MED ORDER — LACTATED RINGERS IV SOLN
500.0000 mL | Freq: Once | INTRAVENOUS | Status: AC
  Administered 2016-03-13: 500 mL via INTRAVENOUS

## 2016-03-13 MED ORDER — ZOLPIDEM TARTRATE 5 MG PO TABS: 5.0000 mg | ORAL_TABLET | Freq: Every evening | ORAL | Status: DC | PRN

## 2016-03-13 MED ORDER — SIMETHICONE 80 MG PO CHEW: 80.0000 mg | CHEWABLE_TABLET | ORAL | Status: DC | PRN

## 2016-03-13 MED ORDER — FENTANYL 2.5 MCG/ML BUPIVACAINE 1/10 % EPIDURAL INFUSION (WH - ANES)
INTRAMUSCULAR | Status: AC
  Filled 2016-03-13: qty 125

## 2016-03-13 MED ORDER — PHENYLEPHRINE 40 MCG/ML (10ML) SYRINGE FOR IV PUSH (FOR BLOOD PRESSURE SUPPORT)
PREFILLED_SYRINGE | INTRAVENOUS | Status: AC
  Filled 2016-03-13: qty 20

## 2016-03-13 MED ORDER — LACTATED RINGERS IV SOLN
INTRAVENOUS | Status: DC
  Administered 2016-03-13: 15:00:00 via INTRAVENOUS
  Administered 2016-03-13: 1000 mL via INTRAVENOUS

## 2016-03-13 MED ORDER — OXYTOCIN 40 UNITS IN LACTATED RINGERS INFUSION - SIMPLE MED
1.0000 m[IU]/min | INTRAVENOUS | Status: DC
  Administered 2016-03-13: 2 m[IU]/min via INTRAVENOUS
  Filled 2016-03-13: qty 1000

## 2016-03-13 MED ORDER — OXYTOCIN 40 UNITS IN LACTATED RINGERS INFUSION - SIMPLE MED: 2.5000 [IU]/h | INTRAVENOUS | Status: DC

## 2016-03-13 MED ORDER — BENZOCAINE-MENTHOL 20-0.5 % EX AERO: 1.0000 "application " | INHALATION_SPRAY | CUTANEOUS | Status: DC | PRN

## 2016-03-13 MED ORDER — COCONUT OIL OIL: 1.0000 "application " | TOPICAL_OIL | Status: DC | PRN

## 2016-03-13 MED ORDER — LIDOCAINE HCL (PF) 1 % IJ SOLN
INTRAMUSCULAR | Status: DC | PRN
  Administered 2016-03-13: 6 mL via EPIDURAL
  Administered 2016-03-13: 7 mL via EPIDURAL

## 2016-03-13 MED ORDER — PRENATAL MULTIVITAMIN CH
1.0000 | ORAL_TABLET | Freq: Every day | ORAL | Status: DC
  Administered 2016-03-14: 1 via ORAL
  Filled 2016-03-13: qty 1

## 2016-03-13 MED ORDER — ACETAMINOPHEN 325 MG PO TABS: 650.0000 mg | ORAL_TABLET | ORAL | Status: DC | PRN

## 2016-03-13 MED ORDER — TERBUTALINE SULFATE 1 MG/ML IJ SOLN
0.2500 mg | Freq: Once | INTRAMUSCULAR | Status: DC | PRN
  Filled 2016-03-13: qty 1

## 2016-03-13 MED ORDER — IBUPROFEN 600 MG PO TABS
600.0000 mg | ORAL_TABLET | Freq: Four times a day (QID) | ORAL | Status: DC
  Administered 2016-03-13 – 2016-03-14 (×4): 600 mg via ORAL
  Filled 2016-03-13 (×4): qty 1

## 2016-03-13 MED ORDER — TETANUS-DIPHTH-ACELL PERTUSSIS 5-2.5-18.5 LF-MCG/0.5 IM SUSP: 0.5000 mL | Freq: Once | INTRAMUSCULAR | Status: DC

## 2016-03-13 MED ORDER — ONDANSETRON HCL 4 MG/2ML IJ SOLN: 4.0000 mg | Freq: Four times a day (QID) | INTRAMUSCULAR | Status: DC | PRN

## 2016-03-13 MED ORDER — OXYCODONE-ACETAMINOPHEN 5-325 MG PO TABS: 1.0000 | ORAL_TABLET | ORAL | Status: DC | PRN

## 2016-03-13 MED ORDER — SOD CITRATE-CITRIC ACID 500-334 MG/5ML PO SOLN: 30.0000 mL | ORAL | Status: DC | PRN

## 2016-03-13 MED ORDER — WITCH HAZEL-GLYCERIN EX PADS: 1.0000 "application " | MEDICATED_PAD | CUTANEOUS | Status: DC | PRN

## 2016-03-13 MED ORDER — SENNOSIDES-DOCUSATE SODIUM 8.6-50 MG PO TABS
2.0000 | ORAL_TABLET | ORAL | Status: DC
  Administered 2016-03-14: 2 via ORAL
  Filled 2016-03-13: qty 2

## 2016-03-13 MED ORDER — LIDOCAINE HCL (PF) 1 % IJ SOLN
30.0000 mL | INTRAMUSCULAR | Status: DC | PRN
  Filled 2016-03-13 (×2): qty 30

## 2016-03-13 MED ORDER — PHENYLEPHRINE 40 MCG/ML (10ML) SYRINGE FOR IV PUSH (FOR BLOOD PRESSURE SUPPORT)
80.0000 ug | PREFILLED_SYRINGE | INTRAVENOUS | Status: DC | PRN
  Filled 2016-03-13: qty 5

## 2016-03-13 MED ORDER — FENTANYL 2.5 MCG/ML BUPIVACAINE 1/10 % EPIDURAL INFUSION (WH - ANES)
14.0000 mL/h | INTRAMUSCULAR | Status: DC | PRN
  Administered 2016-03-13: 14 mL/h via EPIDURAL

## 2016-03-13 MED ORDER — LACTATED RINGERS IV SOLN: 500.0000 mL | INTRAVENOUS | Status: DC | PRN

## 2016-03-13 MED ORDER — DIPHENHYDRAMINE HCL 25 MG PO CAPS: 25.0000 mg | ORAL_CAPSULE | Freq: Four times a day (QID) | ORAL | Status: DC | PRN

## 2016-03-13 MED ORDER — DIBUCAINE 1 % RE OINT: 1.0000 "application " | TOPICAL_OINTMENT | RECTAL | Status: DC | PRN

## 2016-03-13 MED ORDER — OXYTOCIN BOLUS FROM INFUSION: 500.0000 mL | Freq: Once | INTRAVENOUS | Status: DC

## 2016-03-13 MED ORDER — OXYCODONE-ACETAMINOPHEN 5-325 MG PO TABS: 2.0000 | ORAL_TABLET | ORAL | Status: DC | PRN

## 2016-03-13 MED ORDER — FLEET ENEMA 7-19 GM/118ML RE ENEM: 1.0000 | ENEMA | RECTAL | Status: DC | PRN

## 2016-03-13 NOTE — H&P (Addendum)
Summar Coole is a 35 y.o. female presenting for labor IOL at 55 wks for macrosomia and favorable cervix.  Pregnancy complicated by abnormal fetal sono- absent or pelvic right fetal kidney. Good interval growth, suspect macrosomia. Last sono 37 wks 9 lbs, 98% and AC 99%. Normal Glucola, 60 lbs weight gain.   OB History    Gravida Para Term Preterm AB Living   3 1 1   1 1    SAB TAB Ectopic Multiple Live Births     1     1     Past Medical History:  Diagnosis Date  . Allergy   . 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 Hx of varicella   . IUD (intrauterine device) in place 2014   copper   . Meralgia paresthetica of right side 2015  . Routine gynecological examination    Dr. Orvan Seen, [facility]  . Wears glasses    Past Surgical History:  Procedure Laterality Date  . CYST REMOVAL HAND     ganglion, right  . WISDOM TOOTH EXTRACTION     Family History: family history includes Cancer in her maternal grandfather; Crohn's disease in her brother; Diabetes in her paternal grandmother; Heart disease in her paternal grandmother; Macular degeneration in her father and paternal grandmother. Social History:  reports that she has never smoked. She has never used smokeless tobacco. She reports that she drinks about 1.2 oz of alcohol per week . She reports that she does not use drugs.     Maternal Diabetes: No Genetic Screening: Declined Maternal Ultrasounds/Referrals: Abnormal:  Findings:   Fetal Kidney Anomalies - absent right fetal kidney or pelvic.  Fetal Ultrasounds or other Referrals:  None Maternal Substance Abuse:  No Significant Maternal Medications:  None Significant Maternal Lab Results:  Lab values include: Group B Strep negative Other Comments:  None  ROS neg  History Dilation: 4 Effacement (%): 70 Station: -2 Exam by:: M.Merrill, RN Blood pressure 130/75, pulse 92, temperature 98 F (36.7 C), temperature source Axillary, resp. rate 18,  height 6' (1.829 m), weight 267 lb (121.1 kg), SpO2 98 %, unknown if currently breastfeeding. Exam Physical Exam   A&O x 3, no acute distress. Pleasant HEENT neg, no thyromegaly Lungs CTA bilat CV RRR, S1S2 normal Abdo soft, non tender, non acute Extr no edema/ tenderness Pelvic above FHT 140s/ + accels/ no decels/ mod variab- category I Toco regular q 3-4 min  Prenatal labs: ABO, Rh: --/--/B POS (07/25 0757) Antibody: NEG (07/25 0757) Rubella: Immune (03/08 0000) RPR: Nonreactive (03/08 0000)  HBsAg: Negative (03/08 0000)  HIV: Non-reactive (03/08 0000)  GBS: Negative (07/06 0000)  Glucola normal   Assessment/Plan: 35 yo G3P1011, IOL at 39.2 wks for suspected macrosomia. EFW 9.1/2- 10 lbs (based on 9 lbs at 37 wks). GBS(-). Pitocin. Epidural ok. Prepare room for shoulder dystocia, patient was counseled and accepts, understands. Prior 8'6" SVD.  FHT- category I Absent or pelvic right fetal kidney- Peds to f/up   Adolphus Hanf R 03/13/2016, 1:07 PM

## 2016-03-13 NOTE — Anesthesia Preprocedure Evaluation (Signed)
Anesthesia Evaluation  Patient identified by MRN, date of birth, ID band Patient awake    Reviewed: Allergy & Precautions, H&P , NPO status , Patient's Chart, lab work & pertinent test results  Airway Mallampati: I  TM Distance: >3 FB Neck ROM: full    Dental no notable dental hx.    Pulmonary neg pulmonary ROS,    Pulmonary exam normal        Cardiovascular negative cardio ROS Normal cardiovascular exam     Neuro/Psych negative psych ROS   GI/Hepatic negative GI ROS, Neg liver ROS,   Endo/Other  negative endocrine ROS  Renal/GU negative Renal ROS  negative genitourinary   Musculoskeletal   Abdominal (+) + obese,   Peds  Hematology negative hematology ROS (+)   Anesthesia Other Findings   Reproductive/Obstetrics (+) Pregnancy                             Anesthesia Physical Anesthesia Plan  ASA: II  Anesthesia Plan: Epidural   Post-op Pain Management:    Induction:   Airway Management Planned:   Additional Equipment:   Intra-op Plan:   Post-operative Plan:   Informed Consent: I have reviewed the patients History and Physical, chart, labs and discussed the procedure including the risks, benefits and alternatives for the proposed anesthesia with the patient or authorized representative who has indicated his/her understanding and acceptance.     Plan Discussed with:   Anesthesia Plan Comments:         Anesthesia Quick Evaluation

## 2016-03-13 NOTE — Anesthesia Pain Management Evaluation Note (Signed)
  CRNA Pain Management Visit Note  Patient: Brandi Henderson, 35 y.o., female  "Hello I am a member of the anesthesia team at [facility]. We have an anesthesia team available at all times to provide care throughout the hospital, including epidural management and anesthesia for C-section. I don't know your plan for the delivery whether it a natural birth, water birth, IV sedation, nitrous supplementation, doula or epidural, but we want to meet your pain goals."   1.Was your pain managed to your expectations on prior hospitalizations?   Yes   2.What is your expectation for pain management during this hospitalization?     Epidural  3.How can we help you reach that goal? epidural  Record the patient's initial score and the patient's pain goal.   Pain: 1  Pain Goal: 6 [facility] wants you to be able to say your pain was always managed very well.  Everette Rank 03/13/2016

## 2016-03-13 NOTE — Anesthesia Procedure Notes (Signed)
Epidural Patient location during procedure: OB Start time: 03/13/2016 2:41 PM End time: 03/13/2016 2:45 PM  Staffing Anesthesiologist: Lyn Hollingshead Performed: anesthesiologist   Preanesthetic Checklist Completed: patient identified, surgical consent, pre-op evaluation, timeout performed, IV checked, risks and benefits discussed and monitors and equipment checked  Epidural Patient position: sitting Prep: site prepped and draped and DuraPrep Patient monitoring: continuous pulse ox and blood pressure Approach: midline Location: L3-L4 Injection technique: LOR air  Needle:  Needle type: Tuohy  Needle gauge: 17 G Needle length: 9 cm and 9 Needle insertion depth: 8 cm Catheter type: closed end flexible Catheter size: 19 Gauge Catheter at skin depth: 14 cm Test dose: negative and Other  Assessment Sensory level: T9 Events: blood not aspirated, injection not painful, no injection resistance, negative IV test and no paresthesia  Additional Notes Reason for block:procedure for pain

## 2016-03-14 ENCOUNTER — Encounter (HOSPITAL_COMMUNITY): Payer: Self-pay

## 2016-03-14 LAB — CBC
HEMATOCRIT: 37.8 % (ref 36.0–46.0)
HEMOGLOBIN: 12.8 g/dL (ref 12.0–15.0)
MCH: 31.1 pg (ref 26.0–34.0)
MCHC: 33.9 g/dL (ref 30.0–36.0)
MCV: 92 fL (ref 78.0–100.0)
Platelets: 193 10*3/uL (ref 150–400)
RBC: 4.11 MIL/uL (ref 3.87–5.11)
RDW: 13.6 % (ref 11.5–15.5)
WBC: 9.7 10*3/uL (ref 4.0–10.5)

## 2016-03-14 LAB — CCBB MATERNAL DONOR DRAW

## 2016-03-14 MED ORDER — COCONUT OIL OIL: 1.0000 "application " | TOPICAL_OIL | 0 refills | Status: DC | PRN

## 2016-03-14 MED ORDER — IBUPROFEN 600 MG PO TABS: 600.0000 mg | ORAL_TABLET | Freq: Four times a day (QID) | ORAL | 0 refills | Status: DC

## 2016-03-14 NOTE — Progress Notes (Signed)
Patient ID: Brandi Henderson, female   DOB: 11/01/1980, 35 y.o.   MRN: [id number] PPD 1 SVD  S:  Reports feeling well, would like DC today.             Tolerating po/ No nausea or vomiting             Bleeding is decreasing             Pain controlled with PO meds             Up ad lib / ambulatory / voiding well   Newborn  Information for the patient's newborn:  Brandi, Henderson [id number]  female  "Brandi Henderson" breast feeding  / Circumcision planned on 8th day, outpatient.    O:  A & O x 3 NAD             VS:  Vitals:   03/13/16 2013 03/13/16 2113 03/14/16 0115 03/14/16 0629  BP: 133/63 126/73 124/73 123/69  Pulse: 83 92 88 75  Resp: 16 16 18 18   Temp: 98.3 F (36.8 C) 98.4 F (36.9 C) 98.5 F (36.9 C) 97.9 F (36.6 C)  TempSrc: Oral Oral Oral Oral  SpO2: 100% 100% 100%   Weight:      Height:        LABS:  Recent Labs  03/13/16 0757 03/14/16 0610  WBC 8.4 9.7  HGB 13.6 12.8  HCT 39.8 37.8  PLT 204 193    Blood type: --/--/B POS (07/25 0757)  Rubella: Immune (03/08 0000)   I&O: I/O last 3 completed shifts: In: -  Out: 300 [Blood:300]   No intake/output data recorded.    Abdomen: soft, non-tender, non-distended             Fundus: firm, non-tender, U even  Perineum: no edema  Lochia: small  Extremities: trace edema, no calf pain or tenderness,   A/P: PPD # 1 35 y.o., [id number]    Principal Problem:   Postpartum care following vaginal delivery (7/25) Active Problems:   LGA (large for gestational age) fetus affecting mother, antepartum   SVD (spontaneous vaginal delivery)   Doing well - stable status  Routine post partum orders  DC to rooming in.   Brandi Henderson, CNM 03/14/2016, 1:14 PM

## 2016-03-14 NOTE — Discharge Summary (Signed)
Obstetric Discharge Summary Reason for Admission: induction of labor and macrosomia Prenatal Procedures: ultrasound Intrapartum Procedures: spontaneous vaginal delivery Postpartum Procedures: none Complications-Operative and Postpartum: 1st degree perineal laceration Hemoglobin  Date Value Ref Range Status  03/14/2016 12.8 12.0 - 15.0 g/dL Final   HCT  Date Value Ref Range Status  03/14/2016 37.8 36.0 - 46.0 % Final    Physical Exam:  General: alert, cooperative and no distress Lochia: appropriate Uterine Fundus: firm Incision: healing well DVT Evaluation: No evidence of DVT seen on physical exam. No significant calf/ankle edema.  Discharge Diagnoses: Term Pregnancy-delivered  Discharge Information: Date: 03/14/2016 Activity: pelvic rest Diet: routine Medications: PNV and Ibuprofen Condition: stable Instructions: refer to practice specific booklet Discharge to: home Follow-up [place], [place]. Schedule an appointment as soon as possible for a visit in 6 week(s).   Specialty:  Obstetrics and Gynecology Why:  Postpartum visit Contact information: [place] [address] [phone]           Newborn Data: Live born female "[name]" Birth Weight: 10 lb 1.7 oz (4584 g) APGAR: 8, 9  Home with mother.  [name], CNM 03/14/2016, 3:02 PM

## 2016-03-14 NOTE — Progress Notes (Signed)
Measured baby again at 0046, measurements the same as delivery summary except first measurements placed in cm instead of inches.

## 2016-03-14 NOTE — Lactation Note (Signed)
This note was copied from a baby's chart. Lactation Consultation Note  Experience BF mother reports that BF is going well.  She is using an off-center latch.  Showed her how to identify swallows. Reports knowing how to hand express. IBCLC explained to her that if extra calories needed baby could be spoon fed her expressed milk. Patient Name: Brandi Henderson [id number] Date: 03/14/2016 Reason for consult: Initial assessment   Maternal Data Has patient been taught Hand Expression?:  (reports knowing how) Does the patient have breastfeeding experience prior to this delivery?: Yes  Feeding Feeding Type: Breast Fed Length of feed: 7 min  LATCH Score/Interventions Latch: Repeated attempts needed to sustain latch, nipple held in mouth throughout feeding, stimulation needed to elicit sucking reflex.  Audible Swallowing: A few with stimulation  Type of Nipple: Everted at rest and after stimulation  Comfort (Breast/Nipple): Soft / non-tender     Hold (Positioning): No assistance needed to correctly position infant at breast.  LATCH Score: 8  Lactation Tools Discussed/Used     Consult Status Consult Status: PRN    Van Clines 03/14/2016, 12:00 PM

## 2016-03-14 NOTE — Anesthesia Postprocedure Evaluation (Signed)
Anesthesia Post Note  Patient: Brandi Henderson  Procedure(s) Performed: * No procedures listed *  Patient location during evaluation: Mother Baby Anesthesia Type: Epidural Level of consciousness: awake and alert and oriented Pain management: satisfactory to patient Vital Signs Assessment: post-procedure vital signs reviewed and stable Respiratory status: spontaneous breathing and nonlabored ventilation Cardiovascular status: stable Postop Assessment: no headache, no backache, no signs of nausea or vomiting, adequate PO intake and patient able to bend at knees (patient up walking) Anesthetic complications: no     Last Vitals:  Vitals:   03/14/16 0115 03/14/16 0629  BP: 124/73 123/69  Pulse: 88 75  Resp: 18 18  Temp: 36.9 C 36.6 C    Last Pain:  Vitals:   03/14/16 0629  TempSrc: Oral  PainSc:    Pain Goal: Patients Stated Pain Goal: 1 (03/14/16 0429)               [name]

## 2016-03-18 ENCOUNTER — Inpatient Hospital Stay (HOSPITAL_COMMUNITY): Admission: AD | Admit: 2016-03-18 | Payer: Self-pay | Source: Ambulatory Visit | Admitting: Obstetrics & Gynecology

## 2016-03-21 ENCOUNTER — Telehealth: Payer: Self-pay | Admitting: Medical

## 2016-03-21 NOTE — Telephone Encounter (Signed)
Please call and congratulate her on her pregnancy from [place].   Hope she is doing well.

## 2016-03-22 NOTE — Telephone Encounter (Signed)
Pt is aware.  

## 2016-08-08 ENCOUNTER — Ambulatory Visit (INDEPENDENT_AMBULATORY_CARE_PROVIDER_SITE_OTHER): Payer: 59 | Admitting: Family Medicine

## 2016-08-08 ENCOUNTER — Encounter: Payer: Self-pay | Admitting: Family Medicine

## 2016-08-08 VITALS — BP 110/78 | HR 96 | Temp 98.6°F | Resp 16 | Wt 232.0 lb

## 2016-08-08 DIAGNOSIS — J0140 Acute pansinusitis, unspecified: Secondary | ICD-10-CM | POA: Diagnosis not present

## 2016-08-08 DIAGNOSIS — R05 Cough: Secondary | ICD-10-CM | POA: Diagnosis not present

## 2016-08-08 DIAGNOSIS — R059 Cough, unspecified: Secondary | ICD-10-CM

## 2016-08-08 MED ORDER — AMOXICILLIN 875 MG PO TABS: 875.0000 mg | ORAL_TABLET | Freq: Two times a day (BID) | ORAL | 0 refills | Status: DC

## 2016-08-08 NOTE — Progress Notes (Signed)
Subjective: Chief Complaint  Patient presents with  . sinus issues    sinus pressure, headache, congestion, cough, low grade fever.     Brandi Henderson is a 35 y.o. female who is breastfeeding and presents for a one week history of fever up to 102 at home, nasal congestion, sinus pressure, and sore throat. States she was feeling better and then her symptoms worsened approximately 3 days ago. She also reports cough  Denies ear pain, chest pain, palpitations, shortness of breath, abdominal pain, N/V/D.  Does not smoke.   Treatment to date: tylenol.  Positive sick contacts.  No other aggravating or relieving factors.  No other c/o.  ROS as in subjective.   Objective: Vitals:   08/08/16 1330  BP: 110/78  Pulse: 96  Resp: 16  Temp: 98.6 F (37 C)    General appearance: Alert, WD/WN, no distress, mildly ill appearing                             Skin: warm, no rash                           Head: + maxillary and frontal sinus tenderness                            Eyes: conjunctiva normal, corneas clear, PERRLA                            Ears: pearly TMs, external ear canals normal                          Nose: septum midline, turbinates swollen, with erythema and thick discharge             Mouth/throat: MMM, tongue normal, mild pharyngeal erythema                           Neck: supple, no adenopathy, no thyromegaly, nontender                          Heart: RRR, normal S1, S2, no murmurs                         Lungs: CTA bilaterally, no wheezes, rales, or rhonchi      Assessment: Acute pansinusitis, recurrence not specified  Cough   Plan: Discussed diagnosis and treatment of URI.  Amoxicillin prescribed and advised to be aware if her baby develops a rash, diarrhea or any adverse reaction to stop the medication since she is breastfeeding. Suggested symptomatic OTC remedies. Nasal saline spray and neti pot for congestion. Short term use of claritin for drainage.  Tylenol  OTC for fever and malaise.  Call/return if not back to baseline after completing the antibiotic.

## 2017-06-06 ENCOUNTER — Ambulatory Visit (INDEPENDENT_AMBULATORY_CARE_PROVIDER_SITE_OTHER): Payer: 59 | Admitting: Family Medicine

## 2017-06-06 ENCOUNTER — Ambulatory Visit: Payer: 59 | Admitting: Family Medicine

## 2017-06-06 ENCOUNTER — Encounter: Payer: Self-pay | Admitting: Family Medicine

## 2017-06-06 VITALS — BP 120/90 | HR 81 | Temp 98.5°F | Ht 71.5 in | Wt 202.6 lb

## 2017-06-06 DIAGNOSIS — Z7689 Persons encountering health services in other specified circumstances: Secondary | ICD-10-CM

## 2017-06-06 DIAGNOSIS — L700 Acne vulgaris: Secondary | ICD-10-CM | POA: Diagnosis not present

## 2017-06-06 DIAGNOSIS — O874 Varicose veins of lower extremity in the puerperium: Secondary | ICD-10-CM

## 2017-06-06 MED ORDER — BENZOYL PEROXIDE 2.75 % EX GEL: CUTANEOUS | 3 refills | Status: DC

## 2017-06-06 MED ORDER — MINOCYCLINE HCL 50 MG PO CAPS: 50.0000 mg | ORAL_CAPSULE | Freq: Two times a day (BID) | ORAL | 2 refills | Status: DC

## 2017-06-06 NOTE — Progress Notes (Signed)
Patient presents to clinic today to establish care and for f/u on chronic conditions.  SUBJECTIVE: PMH:  Pt is a 36 yo female with pmh sig for acne.  Pt was formerly seen at [facility] by Chana Bode, PA-C. Pt is also seen by OB/GYN, Dr. Lisbeth Renshaw at [facility].  Acne: -started in 20s. -was taking minocycline po prior to pregnancy which helped -starting to flair up on face, painful.  Areas typically large cyst like -uses cleanser on face daily.  Varicose veins: -noticing more on calves after having kids. -sore at the end of the work day. -aesthetically displeasing  Allergies: Hormonal contraceptives cause erythema nodosum  Social hx: Pt has been married x 7 yrs.  She has two kids, ages 28 mo and 68 yo.  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  She does not use tobacco or drugs. Endorses rare EtOH use.  Exercising 3-4 x/wk, drinking 16-24 oz of water per day.  FMHx: Mom-AAW Dad-Tinnitus, macular degeneration Younger Brother-Chron's Dz MGM-dementia PGM-DM, MI PGF-stroke, cancer   Health Maintenance: Dental --Dr. Raynald Kemp Vision --[facility] Immunizations -- last flu 05/2017 PAP -- 04/2016    Past Medical History:  Diagnosis Date  . Allergy   . 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 Hx of varicella   . IUD (intrauterine device) in place 2014   copper   . LGA (large for gestational age) fetus affecting mother, antepartum 03/13/2016  . Meralgia paresthetica of right side 2015  . Routine gynecological examination    Dr. Orvan Seen, [facility]  . Wears glasses     Past Surgical History:  Procedure Laterality Date  . CYST REMOVAL HAND     ganglion, right  . WISDOM TOOTH EXTRACTION      No current outpatient prescriptions on file prior to visit.   No current facility-administered medications on file prior to visit.     Allergies  Allergen Reactions  . Estrogens     Birth  control pills, erythema nodosum    Family History  Problem Relation Age of Onset  . Crohn's disease Brother   . Macular degeneration Father   . Cancer Maternal Grandfather   . Diabetes Paternal Grandmother   . Heart disease Paternal Grandmother   . Macular degeneration Paternal Grandmother   . Stroke Neg Hx   . Hypertension Neg Hx   . Hyperlipidemia Neg Hx     Social History   Social History  . Marital status: Married    Spouse name: N/A  . Number of children: N/A  . Years of education: N/A   Occupational History  . Not on file.   Social History Main Topics  . Smoking status: Never Smoker  . Smokeless tobacco: Never Used  . Alcohol use 1.2 oz/week    1 Glasses of wine, 1 Cans of beer per week  . Drug use: No  . Sexual activity: Yes    Birth control/ protection: IUD   Other Topics Concern  . Not on file   Social History Narrative   Exercises 4 days per week with walking, running, biking.  Married, has 69mo son as of 8/15.   Moved from [place], [place].  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 Jewish    ROS General: Denies fever, chills, night sweats, changes in weight, changes in appetite HEENT: Denies headaches, ear pain, changes in vision, rhinorrhea, sore throat CV: Denies CP, palpitations, SOB, orthopnea Pulm:  Denies SOB, cough, wheezing GI: Denies abdominal pain, nausea, vomiting, diarrhea, constipation GU: Denies dysuria, hematuria, frequency, vaginal discharge Msk: Denies muscle cramps, joint pains Neuro: Denies weakness, numbness, tingling Skin: Denies rashes, bruising.  +Varicose veins, acne Psych: Denies depression, anxiety, hallucinations  BP 120/90 (BP Location: Right Arm, Patient Position: Sitting, Cuff Size: Normal)   Pulse 81   Temp 98.5 F (36.9 C) (Oral)   Ht 5' 11.5" (1.816 m)   Wt 202 lb 9.6 oz (91.9 kg)   LMP 05/21/2017 (Approximate)   Breastfeeding? No   BMI 27.86 kg/m   Physical Exam Gen. Pleasant, well developed, well-nourished, in  NAD HEENT - wearing glasses, [place]/AT, PERRL, no scleral icterus, no nasal drainage, pharynx without erythema or exudate. Neck: No JVD, no thyromegaly Lungs: no use of accessory muscles, CTAB, no wheezes, rales or rhonchi Cardiovascular: RRR, No r/g/m, no peripheral edema Abdomen: BS present, soft, nontender,nondistended, no hepatosplenomegaly Musculoskeletal: No deformities, moves all four extremities, no cyanosis or clubbing, normal tone.  Strength 5/5 in UE and LE b/l. Neuro:  A&Ox3, CN II-XII intact, normal gait Skin:  Warm, dry, intact, no lesions.  Lateral R calf with visible varicose veins, non TTP.  L medial calf with visible varicose veins, non TTP. Psych: normal affect, mood appopriate   No results found for this or any previous visit (from the past 2160 hour(s)).  Assessment/Plan: Acne vulgaris  -Encouraged to continue cleansing face daily -Will try Benzoyl peroxide BID sparingly, if too drying or irritating use daily -Will also restart minocyline for 3 months, then re-evaluate.  Discussed the risk of abx resistance with long term use. - Plan: minocycline (MINOCIN,DYNACIN) 50 MG capsule, Benzoyl Peroxide 2.75 % GEL  Varicose vein of leg, postpartum  -Will send to Vascular given increasing tenderness and aesthetically displeasing. - Plan: Ambulatory referral to Vascular Surgery  Encounter to establish care -records release. -Continue Paps with OB/Gyn.  F/u in 3 months.  Sooner if needed.

## 2017-06-06 NOTE — Patient Instructions (Addendum)
Acne Acne is a skin problem that causes small, red bumps (pimples). Acne happens when the tiny holes in your skin (pores) get blocked. Your pores may become red, sore, and swollen. They may also become infected. Acne is a common skin problem. It is especially common in teenagers. Acne usually goes away over time. Follow these instructions at home: Good skin care is the most important thing you can do to treat your acne. Take care of your skin as told by your doctor. You may be told to do these things:  Wash your skin gently at least two times each day. You should also wash your skin: ? After you exercise. ? Before you go to bed.  Use mild soap.  Use a water-based skin moisturizer after you wash your skin.  Use a sunscreen or sunblock with SPF 30 or greater. This is very important if you are using acne medicines.  Choose cosmetics that will not plug your oil glands (are noncomedogenic).  Medicines  Take over-the-counter and prescription medicines only as told by your doctor.  If you were prescribed an antibiotic medicine, apply or take it as told by your doctor. Do not stop using the antibiotic even if your acne improves. General instructions  Keep your hair clean and off of your face. Shampoo your hair regularly. If you have oily hair, you may need to wash it every day.  Avoid leaning your chin or forehead on your hands.  Avoid wearing tight headbands or hats.  Avoid picking or squeezing your pimples. That can make your acne worse and cause scarring.  Keep all follow-up visits as told by your doctor. This is important.  Shave gently. Only shave when it is necessary.  Keep a food journal. This can help you to see if any foods are linked with your acne. Contact a doctor if:  Your acne is not better after eight weeks.  Your acne gets worse.  You have a large area of skin that is red or tender.  You think that you are having side effects from any acne medicine. This  information is not intended to replace advice given to you by your health care provider. Make sure you discuss any questions you have with your health care provider. Document Released: 07/26/2011 Document Revised: 01/12/2016 Document Reviewed: 10/13/2014 Elsevier Interactive Patient Education  2018 [place].  Varicose Veins Varicose veins are veins that have become enlarged and twisted. They are usually seen in the legs but can occur in other parts of the body as well. What are the causes? This condition is the result of valves in the veins not working properly. Valves in the veins help to return blood from the leg to the heart. If these valves are damaged, blood flows backward and backs up into the veins in the leg near the skin. This causes the veins to become larger. What increases the risk? People who are on their feet a lot, who are pregnant, or who are overweight are more likely to develop varicose veins. What are the signs or symptoms?  Bulging, twisted-appearing, bluish veins, most commonly found on the legs.  Leg pain or a feeling of heaviness. These symptoms may be worse at the end of the day.  Leg swelling.  Changes in skin color. How is this diagnosed? A health care provider can usually diagnose varicose veins by examining your legs. Your health care provider may also recommend an ultrasound of your leg veins. How is this treated? Most varicose veins  can be treated at home.However, other treatments are available for people who have persistent symptoms or want to improve the cosmetic appearance of the varicose veins. These treatment options include:  Sclerotherapy. A solution is injected into the vein to close it off.  Laser treatment. A laser is used to heat the vein to close it off.  Radiofrequency vein ablation. An electrical current produced by radio waves is used to close off the vein.  Phlebectomy. The vein is surgically removed through small incisions made over  the varicose vein.  Vein ligation and stripping. The vein is surgically removed through incisions made over the varicose vein after the vein has been tied (ligated).  Follow these instructions at home:  Do not stand or sit in one position for long periods of time. Do not sit with your legs crossed. Rest with your legs raised during the day.  Wear compression stockings as directed by your health care provider. These stockings help to prevent blood clots and reduce swelling in your legs.  Do not wear other tight, encircling garments around your legs, pelvis, or waist.  Walk as much as possible to increase blood flow.  Raise the foot of your bed at night with 2-inch blocks.  If you get a cut in the skin over the vein and the vein bleeds, lie down with your leg raised and press on it with a clean cloth until the bleeding stops. Then place a bandage (dressing) on the cut. See your health care provider if it continues to bleed. Contact a health care provider if:  The skin around your ankle starts to break down.  You have pain, redness, tenderness, or hard swelling in your leg over a vein.  You are uncomfortable because of leg pain. This information is not intended to replace advice given to you by your health care provider. Make sure you discuss any questions you have with your health care provider. Document Released: 05/16/2005 Document Revised: 01/12/2016 Document Reviewed: 02/07/2016 Elsevier Interactive Patient Education  2017 [place]. Varicose Vein Surgery Varicose vein surgery is a procedure to remove or repair varicose veins. These are swollen, twisted veins that are visible under the skin, especially in your legs. These veins may appear blue and bulging. All veins have a valve that keeps blood flowing in only one direction. If these valves get weak or damaged, blood can pool and cause varicose veins. You may have varicose vein surgery if your varicose veins are causing symptoms  or complications, or if lifestyle changes have not helped. The surgery can reduce pain, aching, and the risk of bleeding and blood clots. Surgery can also improve the way the affected area looks (cosmetic appearance). The different types of varicose vein surgery include:  Injecting a chemical to close off a vein (sclerotherapy).  Treating a vein with light energy (laser treatment).  Using lasers or radio waves to close off a vein with heat (radiofrequency vein ablation).  Surgically removing the vein through a small incision (phlebectomy).  Surgically removing the vein through incisions after the vein has been tied off (vein ligation and stripping).  Your health care provider will discuss the method that is best for you based on your condition. Tell a health care provider about:  Any allergies you have.  All medicines you are taking, including vitamins, herbs, eye drops, creams, and over-the-counter medicines.  Any problems you or family members have had with anesthetic medicines.  Any blood disorders you have.  Any surgeries you have had.  Any medical conditions you have. What are the risks? Generally, this is a safe procedure. However, problems can occur and include:  Damage to nearby nerves, tissues, or veins.  Sores.  Dark spots.  Skin irritation.  Numbness.  Clotting.  Infection.  Scarring.  Leg swelling.  Need for additional treatments.  What happens before the procedure?  Ask your health care provider about: ? Changing or stopping your regular medicines. This is especially important if you are taking diabetes medicines or blood thinners. ? Taking medicines such as aspirin and ibuprofen. These medicines can thin your blood. Do not take these medicines before your procedure if your health care provider instructs you not to.  You may have tests before varicose vein surgery. These can include a test to: ? Check for clots and check blood flow using sound  waves (Doppler ultrasound). ? Observe how blood flows through your veins by injecting a dye that outlines your veins on X-rays (angiogram). This test is used in rare cases. What happens during the procedure? The procedure will vary depending on which type of varicose vein surgery you have: Sclerotherapy  This procedure is often used for small to medium veins.  A chemical (sclerosant) that irritates the lining of the vein will be injected into the vein. This will cause the varicose vein to be closed off.  Sclerosants in different amounts and strengths can be used, depending on the size and location of the vein.  You may need more than one treatment. Laser Treatment  This procedure does not involve incisions or chemicals.  Light energy from a laser will be directed onto the vein.  Laser treatment may be combined with sclerotherapy.  You may need more than one treatment. Radiofrequency Vein Ablation  You will be given a medicine that numbs the area (local anesthetic).  A small surgical cut (incision) will be made near the varicose vein.  A narrow tube (catheter) will be threaded into your vein.  The tip of the catheter will use either a laser or radio waves to close the vein with heat. Phlebectomy  This surgical procedure is used to remove the veins closest to the skin.  You will be given a medicine that numbs the area (local anesthetic).  The surgeon will make a small puncture close to the varicose vein and then use a tiny hook to pull out the vein. Vein Ligation and Stripping  This surgical procedure is used to treat severe cases.  For this procedure, you will be given a medicine that makes you go to sleep (general anesthetic).  The surgeon will make a small incision near the vein in your groin (saphenous vein).  First the surgeon will tie off (ligate) the vein.  Then the surgeon will make several more incisions along the vein.  The vein will be removed  (stripped).  It may take 1-4 weeks to recover completely. What happens after the procedure?  Depending on the type of procedure that is performed, you may be able to return to your usual activities the day after the procedure.  You may have to wear compression stockings. These stockings help to prevent blood clots and reduce swelling in your legs. This information is not intended to replace advice given to you by your health care provider. Make sure you discuss any questions you have with your health care provider. Document Released: 08/26/2007 Document Revised: 01/12/2016 Document Reviewed: 01/13/2014 Elsevier Interactive Patient Education  [employer].

## 2017-06-10 ENCOUNTER — Telehealth: Payer: Self-pay | Admitting: Emergency Medicine

## 2017-06-10 NOTE — Telephone Encounter (Signed)
Pharmacy does not have Benzoyl Peroxide 2.75% how ever they do have 2.5%. Is it okay for the pharmacy to dispense the 2.5% instead of the 2.75%?

## 2017-06-10 NOTE — Telephone Encounter (Signed)
Yes the 2.5% is ok

## 2017-06-11 NOTE — Telephone Encounter (Signed)
Spoke with pharmacist and gave them the verbal order per Dr. Volanda Napoleon to fill the 2.5% instead. Nothing further needed.

## 2017-06-14 ENCOUNTER — Other Ambulatory Visit: Payer: Self-pay

## 2017-06-14 DIAGNOSIS — O874 Varicose veins of lower extremity in the puerperium: Secondary | ICD-10-CM

## 2017-07-10 ENCOUNTER — Ambulatory Visit (INDEPENDENT_AMBULATORY_CARE_PROVIDER_SITE_OTHER): Payer: 59 | Admitting: Vascular Surgery

## 2017-07-10 ENCOUNTER — Ambulatory Visit (HOSPITAL_COMMUNITY)
Admission: RE | Admit: 2017-07-10 | Discharge: 2017-07-10 | Disposition: A | Discharge status: Home / Self Care | Payer: 59 | Source: Ambulatory Visit | Attending: Vascular Surgery | Admitting: Vascular Surgery

## 2017-07-10 ENCOUNTER — Encounter: Payer: Self-pay | Admitting: Vascular Surgery

## 2017-07-10 VITALS — BP 119/80 | HR 72 | Temp 97.8°F | Resp 16 | Ht 71.5 in | Wt 204.0 lb

## 2017-07-10 DIAGNOSIS — O874 Varicose veins of lower extremity in the puerperium: Secondary | ICD-10-CM | POA: Insufficient documentation

## 2017-07-10 DIAGNOSIS — I83813 Varicose veins of bilateral lower extremities with pain: Secondary | ICD-10-CM | POA: Diagnosis not present

## 2017-07-10 NOTE — Progress Notes (Signed)
Patient name: Brandi Henderson MRN: [id number] DOB: 1981/05/12 Sex: female   REASON FOR CONSULT:    Bilateral varicose veins.  The consult is requested by Dr. Volanda Napoleon.  HPI:   Brandi Henderson is a pleasant 36 y.o. female, who presents with painful varicose veins bilaterally.  She had some small varicose veins when she was younger but over the last 4 years she has had 2 children and since that time her varicose veins have increased significantly in size.  She describes some soreness associated with her varicose veins.  Her symptoms are aggravated by standing.  She does not wear compression stockings.  She has not really elevated her legs much.  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  She is unaware of any history of DVT or phlebitis.  Past Medical History:  Diagnosis Date  . Allergy   . 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 Hx of varicella   . IUD (intrauterine device) in place 2014   copper   . LGA (large for gestational age) fetus affecting mother, antepartum 03/13/2016  . Meralgia paresthetica of right side 2015  . Routine gynecological examination    Dr. Orvan Seen, [facility]  . Wears glasses     Family History  Problem Relation Age of Onset  . Crohn's disease Brother   . Macular degeneration Father   . Cancer Maternal Grandfather   . Diabetes Paternal Grandmother   . Heart disease Paternal Grandmother   . Macular degeneration Paternal Grandmother   . Stroke Neg Hx   . Hypertension Neg Hx   . Hyperlipidemia Neg Hx     SOCIAL HISTORY: She is not a smoker. Social History   Socioeconomic History  . Marital status: Married    Spouse name: Not on file  . Number of children: Not on file  . Years of education: Not on file  . Highest education level: Not on file  Social Needs  . Financial resource strain: Not on file  . Food insecurity - worry: Not on file  . Food insecurity - inability: Not on file    . Transportation needs - medical: Not on file  . Transportation needs - non-medical: Not on file  Occupational History  . Not on file  Tobacco Use  . Smoking status: Never Smoker  . Smokeless tobacco: Never Used  Substance and Sexual Activity  . Alcohol use: Yes    Alcohol/week: 1.2 oz    Types: 1 Glasses of wine, 1 Cans of beer per week  . Drug use: No  . Sexual activity: Yes    Birth control/protection: IUD  Other Topics Concern  . Not on file  Social History Narrative   Exercises 4 days per week with walking, running, biking.  Married, has 59mo son as of 8/15.   Moved from [place], [place].  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 Jewish    Allergies  Allergen Reactions  . Estrogens     Birth control pills, erythema nodosum    Current Outpatient Medications  Medication Sig Dispense Refill  . Benzoyl Peroxide 2.75 % GEL Use sparingly twice a day.  If have peeling or excessive drying of skin, use once daily. 1 Tube 3  . minocycline (MINOCIN,DYNACIN) 50 MG capsule Take 1 capsule (50 mg total) by mouth 2 (two) times daily. 60 capsule 2  . Multiple Vitamin (MULTIVITAMIN) tablet Take 1 tablet by mouth daily.  No current facility-administered medications for this visit.     REVIEW OF SYSTEMS:  [X]  denotes positive finding, [ ]  denotes negative finding Cardiac  Comments:  Chest pain or chest pressure:    Shortness of breath upon exertion:    Short of breath when lying flat:    Irregular heart rhythm:        Vascular    Pain in calf, thigh, or hip brought on by ambulation:    Pain in feet at night that wakes you up from your sleep:     Blood clot in your veins:    Leg swelling:         Pulmonary    Oxygen at home:    Productive cough:     Wheezing:         Neurologic    Sudden weakness in arms or legs:  X  when exercising  Sudden numbness in arms or legs:  X   Sudden onset of difficulty speaking or slurred speech:    Temporary loss of vision in one eye:     Problems  with dizziness:         Gastrointestinal    Blood in stool:     Vomited blood:         Genitourinary    Burning when urinating:     Blood in urine:        Psychiatric    Major depression:         Hematologic    Bleeding problems:    Problems with blood clotting too easily:        Skin    Rashes or ulcers:        Constitutional    Fever or chills:     PHYSICAL EXAM:   Vitals:   07/10/17 1157  BP: 119/80  Pulse: 72  Resp: 16  Temp: 97.8 F (36.6 C)  TempSrc: Oral  SpO2: 98%  Weight: 204 lb (92.5 kg)  Height: 5' 11.5" (1.816 m)    GENERAL: The patient is a well-nourished female, in no acute distress. The vital signs are documented above. CARDIAC: There is a regular rate and rhythm.  VASCULAR: I do not detect carotid bruits. She has palpable pedal pulses. She has significant truncal varicosities in the right leg along the medial thigh and medial posterior calf. She has significant truncal varicosities along the medial left thigh and medial posterior calf. She has no significant lower extremity swelling. PULMONARY: There is good air exchange bilaterally without wheezing or rales. ABDOMEN: Soft and non-tender with normal pitched bowel sounds.  MUSCULOSKELETAL: There are no major deformities or cyanosis. NEUROLOGIC: No focal weakness or paresthesias are detected. SKIN: There are no ulcers or rashes noted. PSYCHIATRIC: The patient has a normal affect.  DATA:    VENOUS DUPLEX: I have independently interpreted her venous duplex scan that was done today.  On the right side there is no evidence of DVT or superficial thrombophlebitis.  There is deep venous reflux involving the common femoral vein.  There is reflux in the superficial system involving the great saphenous vein and small saphenous vein.  The right great saphenous vein in the thigh is significantly dilated.  On the left side there is no evidence of DVT or superficial thrombophlebitis.  There is deep venous  reflux involving the common femoral vein.  There is superficial venous reflux involving the left great saphenous vein.  MEDICAL ISSUES:   BILATERAL PAINFUL VARICOSE VEINS: This patient has large truncal  varicosities bilaterally and reflux in the saphenous veins bilaterally.  She has not been wearing compression stockings.  I have written her a prescription for thigh-high compression stockings with a gradient of 20-30 mmHg.  We have also discussed the importance of intermittent leg elevation and the proper positioning for this.  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  I also encouraged her to avoid prolonged sitting and standing.  We discussed the importance of exercise and water aerobics which I think is also very helpful for patients with venous insufficiency.  We will see her back in 3 months.  If her symptoms progress she could potentially be considered for endovenous thermal ablation of the saphenous veins and stab phlebectomies bilaterally.  Deitra Mayo [facility] [phone]

## 2017-09-12 ENCOUNTER — Ambulatory Visit (INDEPENDENT_AMBULATORY_CARE_PROVIDER_SITE_OTHER): Payer: 59 | Admitting: Family Medicine

## 2017-09-12 ENCOUNTER — Encounter: Payer: Self-pay | Admitting: Family Medicine

## 2017-09-12 VITALS — BP 110/80 | HR 74 | Temp 98.7°F | Wt 200.3 lb

## 2017-09-12 DIAGNOSIS — L700 Acne vulgaris: Secondary | ICD-10-CM | POA: Diagnosis not present

## 2017-09-12 MED ORDER — MINOCYCLINE HCL 50 MG PO CAPS: 50.0000 mg | ORAL_CAPSULE | Freq: Two times a day (BID) | ORAL | 1 refills | Status: DC

## 2017-09-12 NOTE — Patient Instructions (Addendum)
Acne Acne is a skin problem that causes pimples. Acne occurs when the pores in the skin get blocked. The pores may become infected with bacteria, or they may become red, sore, and swollen. Acne is a common skin problem, especially for teenagers. Acne usually goes away over time. What are the causes? Each pore contains an oil gland. Oil glands make an oily substance that is called sebum. Acne happens when these glands get plugged with sebum, dead skin cells, and dirt. Then, the bacteria that are normally found in the oil glands multiply and cause inflammation. Acne is commonly triggered by changes in your hormones. These hormonal changes can cause the oil glands to get bigger and to make more sebum. Factors that can make acne worse include:  Hormone changes during: ? Adolescence. ? Women's menstrual cycles. ? Pregnancy.  Oil-based cosmetics and hair products.  Harshly scrubbing the skin.  Strong soaps.  Stress.  Hormone problems that are due to certain diseases.  Long or oily hair rubbing against the skin.  Certain medicines.  Pressure from headbands, backpacks, or shoulder pads.  Exposure to certain oils and chemicals.  What increases the risk? This condition is more likely to develop in:  Teenagers.  People who have a family history of acne.  What are the signs or symptoms? Acne often occurs on the face, neck, chest, and upper back. Symptoms include:  Small, red bumps (pimples or papules).  Whiteheads.  Blackheads.  Small, pus-filled pimples (pustules).  Big, red pimples or pustules that feel tender.  More severe acne can cause:  An infected area that contains a collection of pus (abscess).  Hard, painful, fluid-filled sacs (cysts).  Scars.  How is this diagnosed? This condition is diagnosed with a medical history and physical exam. Blood tests may also be done. How is this treated? Treatment for this condition can vary depending on the severity of your  acne. Treatment may include:  Creams and lotions that prevent oil glands from clogging.  Creams and lotions that treat or prevent infections and inflammation.  Antibiotic medicines that are applied to the skin or taken as a pill.  Pills that decrease sebum production.  Birth control pills.  Light or laser treatments.  Surgery.  Injections of medicine into the affected areas.  Chemicals that cause peeling of the skin.  Your health care provider will also recommend the best way to take care of your skin. Good skin care is the most important part of treatment. Follow these instructions at home: Skin care Take care of your skin as told by your health care provider. You may be told to do these things:  Wash your skin gently at least two times each day, as well as: ? After you exercise. ? Before you go to bed.  Use mild soap.  Apply a water-based skin moisturizer after you wash your skin.  Use a sunscreen or sunblock with SPF 30 or greater. This is especially important if you are using acne medicines.  Choose cosmetics that will not plug your oil glands (are noncomedogenic).  Medicines  Take over-the-counter and prescription medicines only as told by your health care provider.  If you were prescribed an antibiotic medicine, apply or take it as told by your health care provider. Do not stop taking the antibiotic even if your condition improves. General instructions  Keep your hair clean and off of your face. If you have oily hair, shampoo your hair regularly or daily.  Avoid leaning your chin or   forehead against your hands.  Avoid wearing tight headbands or hats.  Avoid picking or squeezing your pimples. That can make your acne worse and cause scarring.  Keep all follow-up visits as told by your health care provider. This is important.  Shave gently and only when necessary.  Keep a food journal to figure out if any foods are linked with your acne. Contact a health  care provider if:  Your acne is not better after eight weeks.  Your acne gets worse.  You have a large area of skin that is red or tender.  You think that you are having side effects from any acne medicine. This information is not intended to replace advice given to you by your health care provider. Make sure you discuss any questions you have with your health care provider. Document Released: 08/03/2000 Document Revised: 04/06/2016 Document Reviewed: 10/13/2014 Elsevier Interactive Patient Education  2018 [employer]  

## 2017-09-12 NOTE — Progress Notes (Signed)
Subjective:    Patient ID: Brandi Henderson, female    DOB: [birth year], 37 y.o.   MRN: [id number]  No chief complaint on file.   HPI Patient was seen today for f/u.  Pt was last seen on10/18/18.  Pt states she has been using the Benzoyl Peroxide gel and Minocycline 50 mg BID. Pt states she has been using the Benzoyl peroxide gel at night only.  There have been a few times while travelling that she has forgotten to take the Minocycline and she had a breakout on her cheeks.  Pt states for the most part her acne has been under control, but she is worried about what it will do when she stops the minocycline.    Pt also states she has been seen by vein and vascular for her varicose veins.  She is to wear compression hose x 3 months, then be re-evaluated. Past Medical History:  Diagnosis Date  . Allergy   . 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 Hx of varicella   . IUD (intrauterine device) in place 2014   copper   . LGA (large for gestational age) fetus affecting mother, antepartum 03/13/2016  . Meralgia paresthetica of right side 2015  . Routine gynecological examination    Dr. Orvan Seen, [facility]  . Wears glasses     Allergies  Allergen Reactions  . Estrogens     Birth control pills, erythema nodosum    ROS General: Denies fever, chills, night sweats, changes in weight, changes in appetite HEENT: Denies headaches, ear pain, changes in vision, rhinorrhea, sore throat CV: Denies CP, palpitations, SOB, orthopnea Pulm: Denies SOB, cough, wheezing GI: Denies abdominal pain, nausea, vomiting, diarrhea, constipation GU: Denies dysuria, hematuria, frequency, vaginal discharge Msk: Denies muscle cramps, joint pains Neuro: Denies weakness, numbness, tingling Skin: Denies rashes, bruising  +acne Psych: Denies depression, anxiety, hallucinations     Objective:    Blood pressure 110/80, pulse 74, temperature 98.7 F (37.1 C), temperature source  Oral, weight 200 lb 4.8 oz (90.9 kg), not currently breastfeeding.   Gen. Pleasant, well-nourished, in no distress, normal affect   HEENT: /AT, face symmetric, no scleral icterus, PERRLA, nares patent without drainage Cardiovascular: RRR, no peripheral edema Neuro:  A&Ox3, CN II-XII intact, normal gait Skin:  Warm, dry and intact.  Patient wearing makeup on her face.  A few small hyperpigmented areas noted on her right cheek.  A few small pustules noted on her chin.   Wt Readings from Last 3 Encounters:  09/12/17 200 lb 4.8 oz (90.9 kg)  07/10/17 204 lb (92.5 kg)  06/06/17 202 lb 9.6 oz (91.9 kg)    Lab Results  Component Value Date   WBC 9.7 03/14/2016   HGB 12.8 03/14/2016   HCT 37.8 03/14/2016   PLT 193 03/14/2016   GLUCOSE 91 03/23/2014   CHOL 129 03/23/2014   TRIG 55 03/23/2014   HDL 49 03/23/2014   LDLCALC 69 03/23/2014   ALT 15 03/23/2014   AST 18 03/23/2014   NA 138 03/23/2014   K 4.6 03/23/2014   CL 103 03/23/2014   CREATININE 0.66 03/23/2014   BUN 10 03/23/2014   CO2 25 03/23/2014   TSH 1.398 03/23/2014    Assessment/Plan:  Acne vulgaris  -overall skin has improved. -continue Benzoyl peroxide 2.75% daily -will refill minocycline until pt can be seen by Derm to discuss other options instead of long term abx use. - Plan: minocycline (MINOCIN,DYNACIN) 50 MG  capsule, Ambulatory referral to Dermatology   F/u prn.  Grier Mitts, MD

## 2017-10-15 ENCOUNTER — Ambulatory Visit (INDEPENDENT_AMBULATORY_CARE_PROVIDER_SITE_OTHER): Payer: 59 | Admitting: Vascular Surgery

## 2017-10-15 ENCOUNTER — Encounter: Payer: Self-pay | Admitting: Vascular Surgery

## 2017-10-15 VITALS — BP 114/77 | HR 79 | Temp 98.1°F | Resp 16 | Ht 72.0 in | Wt 195.0 lb

## 2017-10-15 DIAGNOSIS — I83813 Varicose veins of bilateral lower extremities with pain: Secondary | ICD-10-CM

## 2017-10-15 NOTE — Progress Notes (Signed)
[facility] of [place]  Patient name: Brandi Henderson MRN: [id number] DOB: 21-Aug-1980 Sex: female  REASON FOR VISIT: Follow-up bilateral painful varicosities  HPI: Brandi Henderson is a 37 y.o. female here today for follow-up.  She was seen approximately 3-4 months ago with Dr. Scot Dock.  At that time she had a formal duplex showing reflux in her great saphenous vein with enlargement bilaterally extending into large varicosities throughout both legs.  These had occurred after pregnancies and have been getting worse ever since.  She has pain associated with these.  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  Profen and also has been wearing thigh-high compression garments but continues to have discomfort despite this.  She is seen today for discussion ablation and phlebectomy for symptom relief.  Past Medical History:  Diagnosis Date  . Allergy   . 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 Hx of varicella   . IUD (intrauterine device) in place 2014   copper   . LGA (large for gestational age) fetus affecting mother, antepartum 03/13/2016  . Meralgia paresthetica of right side 2015  . Routine gynecological examination    Dr. Orvan Seen, [facility]  . Wears glasses     Family History  Problem Relation Age of Onset  . Crohn's disease Brother   . Macular degeneration Father   . Cancer Maternal Grandfather   . Diabetes Paternal Grandmother   . Heart disease Paternal Grandmother   . Macular degeneration Paternal Grandmother   . Stroke Neg Hx   . Hypertension Neg Hx   . Hyperlipidemia Neg Hx     SOCIAL HISTORY: Social History   Tobacco Use  . Smoking status: Never Smoker  . Smokeless tobacco: Never Used  Substance Use Topics  . Alcohol use: Yes    Alcohol/week: 1.2 oz    Types: 1 Glasses of wine, 1 Cans of beer per week    Allergies    Allergen Reactions  . Estrogens     Birth control pills, erythema nodosum    Current Outpatient Medications  Medication Sig Dispense Refill  . Benzoyl Peroxide 2.75 % GEL Use sparingly twice a day.  If have peeling or excessive drying of skin, use once daily. 1 Tube 3  . minocycline (MINOCIN,DYNACIN) 50 MG capsule Take 1 capsule (50 mg total) by mouth 2 (two) times daily. 60 capsule 1  . Multiple Vitamin (MULTIVITAMIN) tablet Take 1 tablet by mouth daily.     No current facility-administered medications for this visit.     REVIEW OF SYSTEMS:  [X]  denotes positive finding, [ ]  denotes negative finding Cardiac  Comments:  Chest pain or chest pressure:    Shortness of breath upon exertion:    Short of breath when lying flat:    Irregular heart rhythm:        Vascular    Pain in calf, thigh, or hip brought on by ambulation:    Pain in feet at night that wakes you up from your sleep:     Blood clot in your veins:    Leg swelling:  x         PHYSICAL EXAM: Vitals:   10/15/17 0859  BP: 114/77  Pulse: 79  Resp: 16  Temp: 98.1 F (36.7 C)  SpO2: 99%  Weight: 195 lb (88.5 kg)  Height: 6' (1.829 m)    GENERAL: The patient is a  well-nourished female, in no acute distress. The vital signs are documented above. CARDIOVASCULAR: Plus dorsalis pedis pulses bilaterally.  Large marked varicosities beginning in her proximal thigh extending throughout the medial and posterior thigh and medial calf bilaterally PULMONARY: There is good air exchange  MUSCULOSKELETAL: There are no major deformities or cyanosis. NEUROLOGIC: No focal weakness or paresthesias are detected. SKIN: There are no ulcers or rashes noted. PSYCHIATRIC: The patient has a normal affect.  DATA:  Her duplex and also reimaged her veins with SonoSite ultrasound.  A marked distention of her saphenous vein extending directly into the large varicose veins the throughout her thigh and calf bilaterally.  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  I have recommended staged bilateral laser ablation of her great saphenous vein and stab phlebectomy of greater than 20 tributary varicosities.  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  She understands that this would typically be done on a Thursday and that she would be out of Friday could return to full activity on Monday.  Also discussed the very slight risk for DVT associated with.  She wishes to proceed as soon as we can assure insurance coverage    Rosetta Posner, MD [facility] of [facility] Tel [phone] Pager [phone]

## 2017-11-05 ENCOUNTER — Other Ambulatory Visit: Payer: Self-pay | Admitting: *Deleted

## 2017-11-05 DIAGNOSIS — I83893 Varicose veins of bilateral lower extremities with other complications: Secondary | ICD-10-CM

## 2017-11-18 ENCOUNTER — Other Ambulatory Visit: Payer: Self-pay | Admitting: Family Medicine

## 2017-11-18 DIAGNOSIS — L700 Acne vulgaris: Secondary | ICD-10-CM

## 2017-11-21 ENCOUNTER — Encounter: Payer: Self-pay | Admitting: Vascular Surgery

## 2017-11-21 ENCOUNTER — Ambulatory Visit (INDEPENDENT_AMBULATORY_CARE_PROVIDER_SITE_OTHER): Payer: 59 | Admitting: Vascular Surgery

## 2017-11-21 VITALS — BP 118/80 | HR 69 | Temp 98.0°F | Resp 16 | Ht 72.0 in | Wt 190.0 lb

## 2017-11-21 DIAGNOSIS — I83893 Varicose veins of bilateral lower extremities with other complications: Secondary | ICD-10-CM

## 2017-11-21 NOTE — Progress Notes (Signed)
     Laser Ablation Procedure    Date: 11/21/2017   Brandi Henderson DOB:1981-08-13  Consent signed: Yes    Surgeon:  Dr. Sherren Mocha Bellatrix Devonshire  Procedure: Laser Ablation: left Greater Saphenous Vein  BP 118/80   Pulse 69   Temp 98 F (36.7 C)   Resp 16   Ht 6' (1.829 m)   Wt 190 lb (86.2 kg)   SpO2 100%   BMI 25.77 kg/m   Tumescent Anesthesia: 475 cc 0.9% NaCl with 50 cc Lidocaine HCL with 1% Epi and 15 cc 8.4% NaHCO3  Local Anesthesia: 6 cc Lidocaine HCL and NaHCO3 (ratio 2:1)  15 watts continuous mode        Total energy: 1127   Total time: 1:15    Stab Phlebectomy: >20 Sites: Thigh and Calf  Patient tolerated procedure well  Notes:   Description of Procedure:  After marking the course of the secondary varicosities, the patient was placed on the operating table in the supine position, and the left leg was prepped and draped in sterile fashion.   Local anesthetic was administered and under ultrasound guidance the saphenous vein was accessed with a micro needle and guide wire; then the mirco puncture sheath was placed.  A guide wire was inserted saphenofemoral junction , followed by a 5 french sheath.  The position of the sheath and then the laser fiber below the junction was confirmed using the ultrasound.  Tumescent anesthesia was administered along the course of the saphenous vein using ultrasound guidance. The patient was placed in Trendelenburg position and protective laser glasses were placed on patient and staff, and the laser was fired at 15 watts continuous mode advancing 1-29mm/second for a total of 1127 joules.   For stab phlebectomies, local anesthetic was administered at the previously marked varicosities, and tumescent anesthesia was administered around the vessels.  Greater than 20 stab wounds were made using the tip of an 11 blade. And using the vein hook, the phlebectomies were performed using a hemostat to avulse the varicosities.  Adequate hemostasis was  achieved.     Steri strips were applied to the stab wounds and ABD pads and thigh high compression stockings were applied.  Ace wrap bandages were applied over the phlebectomy sites and at the top of the saphenofemoral junction. Blood loss was less than 15 cc.  The patient ambulated out of the operating room having tolerated the procedure well.  Uneventful ablation from distal thigh at the saphenofemoral junction and stab phlebectomy of greater than 20 large varicosities over her medial thigh and calf

## 2017-11-26 ENCOUNTER — Ambulatory Visit (HOSPITAL_COMMUNITY)
Admission: RE | Admit: 2017-11-26 | Discharge: 2017-11-26 | Disposition: A | Discharge status: Home / Self Care | Payer: 59 | Source: Ambulatory Visit | Attending: Vascular Surgery | Admitting: Vascular Surgery

## 2017-11-26 ENCOUNTER — Ambulatory Visit (INDEPENDENT_AMBULATORY_CARE_PROVIDER_SITE_OTHER): Payer: 59 | Admitting: Vascular Surgery

## 2017-11-26 ENCOUNTER — Encounter: Payer: Self-pay | Admitting: Vascular Surgery

## 2017-11-26 VITALS — BP 114/72 | HR 68 | Temp 97.6°F | Resp 16 | Ht 72.0 in | Wt 196.0 lb

## 2017-11-26 DIAGNOSIS — Z9889 Other specified postprocedural states: Secondary | ICD-10-CM | POA: Insufficient documentation

## 2017-11-26 DIAGNOSIS — I83893 Varicose veins of bilateral lower extremities with other complications: Secondary | ICD-10-CM

## 2017-11-26 NOTE — Progress Notes (Signed)
   Patient name: Brandi Henderson MRN: [id number] DOB: July 05, 1981 Sex: female  REASON FOR VISIT: Low up left great saphenous vein ablation and greater than 20 stab phlebectomy sites  HPI: Brandi Henderson is a 37 y.o. female here today for follow-up.  Since her procedure last week.  She has been compliant with her compression and ibuprofen.  She reports mild discomfort.  Current Outpatient Medications  Medication Sig Dispense Refill  . Benzoyl Peroxide 2.75 % GEL Use sparingly twice a day.  If have peeling or excessive drying of skin, use once daily. 1 Tube 3  . minocycline (MINOCIN,DYNACIN) 50 MG capsule TAKE 1 CAPSULE (50 MG TOTAL) BY MOUTH 2 (TWO) TIMES DAILY. 60 capsule 1  . Multiple Vitamin (MULTIVITAMIN) tablet Take 1 tablet by mouth daily.     No current facility-administered medications for this visit.      PHYSICAL EXAM: Vitals:   11/26/17 1032  BP: 114/72  Pulse: 68  Resp: 16  Temp: 97.6 F (36.4 C)  SpO2: 98%  Weight: 196 lb (88.9 kg)  Height: 6' (1.829 m)    GENERAL: The patient is a well-nourished female, in no acute distress. The vital signs are documented above. Typical bruising over the phlebectomy sites.  Plexus shows great saphenous vein occluded from mid thigh to 1 cm from the saphenofemoral junction and no DVT  MEDICAL ISSUES: Stable initial follow-up following laser ablation and stab phlebectomy last week.  For similar treatment to her right leg on 12/12/2017   Rosetta Posner, MD [facility] Specialists of [facility] Tel [phone] Pager [phone]

## 2017-12-12 ENCOUNTER — Ambulatory Visit (INDEPENDENT_AMBULATORY_CARE_PROVIDER_SITE_OTHER): Payer: 59 | Admitting: Vascular Surgery

## 2017-12-12 ENCOUNTER — Encounter: Payer: Self-pay | Admitting: Vascular Surgery

## 2017-12-12 VITALS — BP 105/75 | HR 66 | Temp 98.0°F | Resp 16 | Ht 72.0 in | Wt 195.0 lb

## 2017-12-12 DIAGNOSIS — I83893 Varicose veins of bilateral lower extremities with other complications: Secondary | ICD-10-CM | POA: Diagnosis not present

## 2017-12-12 NOTE — Progress Notes (Signed)
     Laser Ablation Procedure    Date: 12/12/2017   Brandi Henderson DOB:1981/06/17  Consent signed: Yes    Surgeon:  Dr. Sherren Mocha Castiel Lauricella  Procedure: Laser Ablation: right Greater Saphenous Vein  BP 105/75   Pulse 66   Temp 98 F (36.7 C)   Resp 16   Ht 6' (1.829 m)   Wt 195 lb (88.5 kg)   SpO2 100%   BMI 26.45 kg/m   Tumescent Anesthesia: 600 cc 0.9% NaCl with 50 cc Lidocaine HCL with 1% Epi and 15 cc 8.4% NaHCO3  Local Anesthesia: 7 cc Lidocaine HCL and NaHCO3 (ratio 2:1)  15 watts continuous mode        Total energy: 2727   Total time: 3:00    Stab Phlebectomy: >20 Sites: Thigh and Calf  Patient tolerated procedure well  Notes:   Description of Procedure:  After marking the course of the secondary varicosities, the patient was placed on the operating table in the supine position, and the right leg was prepped and draped in sterile fashion.   Local anesthetic was administered and under ultrasound guidance the saphenous vein was accessed with a micro needle and guide wire; then the mirco puncture sheath was placed.  A guide wire was inserted saphenofemoral junction , followed by a 5 french sheath.  The position of the sheath and then the laser fiber below the junction was confirmed using the ultrasound.  Tumescent anesthesia was administered along the course of the saphenous vein using ultrasound guidance. The patient was placed in Trendelenburg position and protective laser glasses were placed on patient and staff, and the laser was fired at 15 watts continuous mode advancing 1-31mm/second for a total of 2727 joules.   For stab phlebectomies, local anesthetic was administered at the previously marked varicosities, and tumescent anesthesia was administered around the vessels.  Greater than 20 stab wounds were made using the tip of an 11 blade. And using the vein hook, the phlebectomies were performed using a hemostat to avulse the varicosities.  Adequate hemostasis was  achieved.   [name] strips were applied to the stab wounds and ABD pads and thigh high compression stockings were applied.  Ace wrap bandages were applied over the phlebectomy sites and at the top of the saphenofemoral junction. Blood loss was less than 15 cc.  The patient ambulated out of the operating room having tolerated the procedure well.  Uneventful ablation from proximal calf to just below the saphenofemoral junction and Phlebectomy of multiple varicosities over the medial thigh and medial calf posterior calf and pretibial area.

## 2017-12-13 ENCOUNTER — Encounter: Payer: Self-pay | Admitting: Vascular Surgery

## 2017-12-17 ENCOUNTER — Encounter: Payer: Self-pay | Admitting: Vascular Surgery

## 2017-12-17 ENCOUNTER — Ambulatory Visit (INDEPENDENT_AMBULATORY_CARE_PROVIDER_SITE_OTHER): Payer: Self-pay | Admitting: Vascular Surgery

## 2017-12-17 ENCOUNTER — Ambulatory Visit (HOSPITAL_COMMUNITY)
Admission: RE | Admit: 2017-12-17 | Discharge: 2017-12-17 | Disposition: A | Discharge status: Home / Self Care | Payer: 59 | Source: Ambulatory Visit | Attending: Vascular Surgery | Admitting: Vascular Surgery

## 2017-12-17 VITALS — BP 115/80 | HR 63 | Temp 98.0°F | Resp 16 | Ht 72.0 in | Wt 195.0 lb

## 2017-12-17 DIAGNOSIS — I83893 Varicose veins of bilateral lower extremities with other complications: Secondary | ICD-10-CM | POA: Diagnosis not present

## 2017-12-17 DIAGNOSIS — I83813 Varicose veins of bilateral lower extremities with pain: Secondary | ICD-10-CM

## 2017-12-17 DIAGNOSIS — Z9889 Other specified postprocedural states: Secondary | ICD-10-CM | POA: Diagnosis not present

## 2017-12-17 NOTE — Progress Notes (Signed)
   Patient name: Brandi Henderson MRN: [id number] DOB: [birth year] Sex: female  REASON FOR VISIT: 1 week of laser ablation of right great saphenous vein and multiple stab phlebectomy  HPI: Brandi Henderson is a 37 y.o. female here today for follow-up.  She reports mild discomfort associated with the procedure.  Has been compliant with her compression  Current Outpatient Medications  Medication Sig Dispense Refill  . Benzoyl Peroxide 2.75 % GEL Use sparingly twice a day.  If have peeling or excessive drying of skin, use once daily. 1 Tube 3  . minocycline (MINOCIN,DYNACIN) 50 MG capsule TAKE 1 CAPSULE (50 MG TOTAL) BY MOUTH 2 (TWO) TIMES DAILY. 60 capsule 1  . Multiple Vitamin (MULTIVITAMIN) tablet Take 1 tablet by mouth daily.     No current facility-administered medications for this visit.      PHYSICAL EXAM: Vitals:   12/17/17 1041  BP: 115/80  Pulse: 63  Resp: 16  Temp: 98 F (36.7 C)  SpO2: 100%  Weight: 195 lb (88.5 kg)  Height: 6' (1.829 m)    GENERAL: The patient is a well-nourished female, in no acute distress. The vital signs are documented above. Typical bruising associated with a stab phlebectomy sites.  Duplex today shows occlusion of her saphenous vein from the proximal calf to 1.5 cm from the saphenofemoral junction.  There is partial thrombus up to the confluence with the common femoral vein and no evidence of thrombus within the common femoral vein  MEDICAL ISSUES: Stable status post staged bilateral laser ablation and stab phlebectomy.  She will wear her compression garment for 1 additional week and then as needed.  She is scheduled for sclerotherapy of telangiectasia later next month.  She will see me again on an as-needed basis   Rosetta Posner, MD [facility] V[facility] and Vein Specialists of [facility] Tel [phone] Pager [phone]

## 2018-01-08 ENCOUNTER — Encounter: Payer: Self-pay | Admitting: Vascular Surgery

## 2018-01-08 ENCOUNTER — Ambulatory Visit (INDEPENDENT_AMBULATORY_CARE_PROVIDER_SITE_OTHER): Payer: 59 | Admitting: *Deleted

## 2018-01-08 DIAGNOSIS — I83893 Varicose veins of bilateral lower extremities with other complications: Secondary | ICD-10-CM | POA: Diagnosis not present

## 2018-01-08 DIAGNOSIS — I83813 Varicose veins of bilateral lower extremities with pain: Secondary | ICD-10-CM

## 2018-01-08 NOTE — Progress Notes (Signed)
X=.3% Sotradecol administered with a 27g butterfly.  Patient received a total of 24cc.  Treated all areas of concern with 4 syringes. Easy ! Access. Tol very well. Anticipate good results. Follow prn.              Photos: Yes.    Compression stockings applied: Yes.

## 2018-09-15 DIAGNOSIS — Z3202 Encounter for pregnancy test, result negative: Secondary | ICD-10-CM | POA: Diagnosis not present

## 2019-05-08 ENCOUNTER — Other Ambulatory Visit: Payer: Self-pay

## 2019-05-17 DIAGNOSIS — Z20828 Contact with and (suspected) exposure to other viral communicable diseases: Secondary | ICD-10-CM | POA: Diagnosis not present

## 2019-07-15 ENCOUNTER — Encounter: Payer: Self-pay | Admitting: Family Medicine

## 2019-07-15 ENCOUNTER — Ambulatory Visit (INDEPENDENT_AMBULATORY_CARE_PROVIDER_SITE_OTHER): Payer: BC Managed Care – PPO

## 2019-07-15 ENCOUNTER — Other Ambulatory Visit: Payer: Self-pay

## 2019-07-15 ENCOUNTER — Ambulatory Visit (INDEPENDENT_AMBULATORY_CARE_PROVIDER_SITE_OTHER): Payer: BC Managed Care – PPO | Admitting: Family Medicine

## 2019-07-15 VITALS — BP 110/62 | HR 81 | Temp 97.9°F | Ht 72.0 in | Wt 218.1 lb

## 2019-07-15 DIAGNOSIS — G8929 Other chronic pain: Secondary | ICD-10-CM | POA: Diagnosis not present

## 2019-07-15 DIAGNOSIS — M545 Low back pain: Secondary | ICD-10-CM

## 2019-07-15 NOTE — Progress Notes (Signed)
Salvadore Dom DOB: 05-31-1981 Encounter date: 07/15/2019  This is a 38 y.o. female who presents with Chief Complaint  Patient presents with  . Back Pain    patient complains of back pain x5 years, worse lately x5 weeks, states she recalls pain after trying to restrain son who was having a tantrum    History of present illness: Son is six now; this has been going on for 4-5 years. Not constant. But a few times/year flares up. Something will trigger it. This time was sitting on exercise bike and whole back spasm when she went to grab handlebars. Then this last episode just didn't get better. Not sure what it precipitating this. Has had a couple of days that were ok, but most days between a 6-10 in pain. Not always a 10, but usually getting there.   Most recently was so painful that she couldn't even stretch. Has been trying to stretch because this was helpful when she was able to do it earlier this week. Has used heating pad, has used ibuprofen and tylenol. Doesn't get much relief with these. Also tried muscle relaxer from friend (flexeril) and this didn't help. Also has used tiger balm.   During first episode - she was holding toddler son and he was throwing body weight back and forth; she was trying to stabilize him and just felt whole back twinge - usually central lower back.   Pain radiates through whole back. None to legs, but when she is getting sharp pain she feels like she can't move legs. Sharp pains hang in there for awhile.   No numbness, tingling. Had numbness in legs after oldest son was born but thinks this was before back pain. Started in 3rd trimester and then took awhile after delivery to get sensation back.   Right now pain is 1/10; but at night last week there were night she would wake up screaming in pain.    Allergies  Allergen Reactions  . Estrogens     Birth control pills, erythema nodosum   Current Meds  Medication Sig  . Multiple Vitamin  (MULTIVITAMIN) tablet Take 1 tablet by mouth daily.    Review of Systems  Constitutional: Negative for chills, fatigue and fever.  Respiratory: Negative for cough, chest tightness, shortness of breath and wheezing.   Cardiovascular: Negative for chest pain, palpitations and leg swelling.  Musculoskeletal: Positive for back pain.  Neurological: Negative for weakness and numbness.    Objective:  BP 110/62 (BP Location: Left Arm, Patient Position: Sitting, Cuff Size: Large)   Pulse 81   Temp 97.9 F (36.6 C) (Temporal)   Ht 6' (1.829 m)   Wt 218 lb 1.6 oz (98.9 kg)   LMP 06/23/2019 (Exact Date)   SpO2 99%   BMI 29.58 kg/m   Weight: 218 lb 1.6 oz (98.9 kg)   BP Readings from Last 3 Encounters:  07/15/19 110/62  12/17/17 115/80  12/12/17 105/75   Wt Readings from Last 3 Encounters:  07/15/19 218 lb 1.6 oz (98.9 kg)  12/17/17 195 lb (88.5 kg)  12/12/17 195 lb (88.5 kg)    Physical Exam Constitutional:      General: She is not in acute distress.    Appearance: She is well-developed.  Cardiovascular:     Rate and Rhythm: Normal rate and regular rhythm.     Heart sounds: Normal heart sounds. No murmur. No friction rub.  Pulmonary:     Effort: Pulmonary effort is normal. No respiratory distress.  Breath sounds: Normal breath sounds. No wheezing or rales.  Musculoskeletal:     Right lower leg: No edema.     Left lower leg: No edema.     Comments: No spinal tenderness with palpation.  She has tightness across lower back with flexion, but is able to flex 90 degrees.  She has pain across lower back with extension.  Pain with twisting and tilting more significant on the right.  There is some general lumbar back pain with full straight leg test.  Bilateral lower extremity reflexes are normal.  No pain with hip flexion but there is some sacroiliac tenderness with hip extension.  Neurological:     Mental Status: She is alert and oriented to person, place, and time.     Deep Tendon  Reflexes:     Reflex Scores:      Patellar reflexes are 2+ on the right side and 2+ on the left side.      Achilles reflexes are 2+ on the right side and 2+ on the left side. Psychiatric:        Behavior: Behavior normal.     Assessment/Plan  1. Chronic midline low back pain without sciatica We are going to start with xray and plan follow up pending this result. Pain is well controlled today, so we are not going to give any medications, but I have sent a mychart message to her to let me know if any worsening through weekend.   - DG Lumbar Spine Complete; Future - DG Lumbar Spine Complete    Return for pending xray.     Micheline Rough, MD

## 2019-07-21 ENCOUNTER — Other Ambulatory Visit: Payer: Self-pay | Admitting: Family Medicine

## 2019-07-21 DIAGNOSIS — M545 Low back pain, unspecified: Secondary | ICD-10-CM

## 2019-07-21 DIAGNOSIS — Q7649 Other congenital malformations of spine, not associated with scoliosis: Secondary | ICD-10-CM

## 2019-07-21 DIAGNOSIS — G8929 Other chronic pain: Secondary | ICD-10-CM

## 2019-08-05 ENCOUNTER — Encounter: Payer: Self-pay | Admitting: Family Medicine

## 2019-08-06 NOTE — Telephone Encounter (Signed)
Please advise 

## 2019-08-07 NOTE — Telephone Encounter (Signed)
I spoke with patient.  Between when she first sent the message and today pain is better.  She did take 600 mg of ibuprofen which helped with the pain.  I encouraged her to do this when back flares.  She does have upcoming appointment with Dr. Tamala Julian and will be further evaluated at this time.  She asked about using Arnica in addition to the ibuprofen.  Although I was uncertain of documented benefit with this, she has had improvement with this for other muscle aches and pains in the past and will try on the back.  I also advised Voltaren gel could be used to help with inflammation.  She is motivated to work on therapy or exercises to help prevent further flares and will let me know if she feels that 600 mg of ibuprofen is not working.  I advised when she does have flares taking 600 mg with food 3 times a day for a couple days should help to calm down the inflammation.

## 2019-08-07 NOTE — Telephone Encounter (Signed)
Message forwarded to Dr Ethlyn Gallery for Advise

## 2019-08-18 ENCOUNTER — Other Ambulatory Visit: Payer: Self-pay

## 2019-08-18 ENCOUNTER — Ambulatory Visit (INDEPENDENT_AMBULATORY_CARE_PROVIDER_SITE_OTHER): Payer: BC Managed Care – PPO | Admitting: Family Medicine

## 2019-08-18 ENCOUNTER — Encounter: Payer: Self-pay | Admitting: Family Medicine

## 2019-08-18 DIAGNOSIS — M999 Biomechanical lesion, unspecified: Secondary | ICD-10-CM | POA: Diagnosis not present

## 2019-08-18 DIAGNOSIS — M533 Sacrococcygeal disorders, not elsewhere classified: Secondary | ICD-10-CM

## 2019-08-18 NOTE — Progress Notes (Signed)
[place] [address] [place] Phone: [phone] Subjective:   I [name] am serving as a Education administrator for Dr. Hulan Saas.  This visit occurred during the SARS-CoV-2 public health emergency.  Safety protocols were in place, including screening questions prior to the visit, additional usage of staff PPE, and extensive cleaning of exam room while observing appropriate contact time as indicated for disinfecting solutions.   I'm seeing this patient by the request  of:    CC:   [id number]  Brandi Henderson is a 38 y.o. female coming in with complaint of back pain. States 3 months ago her back has been the worse it has ever been since. Pain radiates up her back. Xray recently. Pain keeps her up at night.   Onset- 5 years  Location- lower back Duration-  Character- sharp, sore Aggravating factors- flexion, extension, twisting and side bending  Reliving factors-  Therapies tried- tiger balm, heat, ibuprofen  Severity-   8/10 at its worse      Past Medical History:  Diagnosis Date  . Allergy   . 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 Hx of varicella   . IUD (intrauterine device) in place 2014   copper   . LGA (large for gestational age) fetus affecting mother, antepartum 03/13/2016  . Meralgia paresthetica of right side 2015  . Routine gynecological examination    Dr. Orvan Seen, [facility]  . Wears glasses    Past Surgical History:  Procedure Laterality Date  . CYST REMOVAL HAND     ganglion, right  . WISDOM TOOTH EXTRACTION     Social History   Socioeconomic History  . Marital status: Married    Spouse name: Not on file  . Number of children: Not on file  . Years of education: Not on file  . Highest education level: Not on file  Occupational History  . Not on file  Tobacco Use  . Smoking status: Never Smoker  . Smokeless tobacco: Never Used  Substance and Sexual  Activity  . Alcohol use: Yes    Alcohol/week: 2.0 standard drinks    Types: 1 Glasses of wine, 1 Cans of beer per week  . Drug use: No  . Sexual activity: Yes    Birth control/protection: I.U.D.  Other Topics Concern  . Not on file  Social History Narrative   Exercises 4 days per week with walking, running, biking.  Married, has 7mo son as of 8/15.   Moved from [place], [place].  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 [employer]   Social Determinants of Health   Financial Resource Strain:   . Difficulty of Paying Living Expenses: Not on file  Food Insecurity:   . Worried About Charity fundraiser in the Last Year: Not on file  . Ran Out of Food in the Last Year: Not on file  Transportation Needs:   . Lack of Transportation (Medical): Not on file  . Lack of Transportation (Non-Medical): Not on file  Physical Activity:   . Days of Exercise per Week: Not on file  . Minutes of Exercise per Session: Not on file  Stress:   . Feeling of Stress : Not on file  Social Connections:   . Frequency of Communication with Friends and Family: Not on file  . Frequency of Social Gatherings with Friends and Family: Not on file  . Attends Religious Services: Not on file  . Active  Member of Clubs or Organizations: Not on file  . Attends Archivist Meetings: Not on file  . Marital Status: Not on file   Allergies  Allergen Reactions  . Estrogens     Birth control pills, erythema nodosum   Family History  Problem Relation Age of Onset  . Crohn's disease Brother   . Macular degeneration Father   . Cancer Maternal Grandfather   . Diabetes Paternal Grandmother   . Heart disease Paternal Grandmother   . Macular degeneration Paternal Grandmother   . Stroke Neg Hx   . Hypertension Neg Hx   . Hyperlipidemia Neg Hx          Current Outpatient Medications (Other):  Marland Kitchen  Multiple Vitamin (MULTIVITAMIN) tablet, Take 1 tablet by mouth daily.    Past medical history, social, surgical and  family history all reviewed in electronic medical record.  No pertanent information unless stated regarding to the chief complaint.   Review of Systems:  No headache, visual changes, nausea, vomiting, diarrhea, constipation, dizziness, abdominal pain, skin rash, fevers, chills, night sweats, weight loss, swollen lymph nodes, body aches, joint swelling, muscle aches, chest pain, shortness of breath, mood changes.   Objective  Blood pressure 110/64, pulse 72, height 6' (1.829 m), weight 215 lb (97.5 kg), SpO2 99 %. Systems examined below as of    General: No apparent distress alert and oriented x3 mood and affect normal, dressed appropriately.  HEENT: Pupils equal, extraocular movements intact  Respiratory: Patient's speak in full sentences and does not appear short of breath  Cardiovascular: No lower extremity edema, non tender, no erythema  Skin: Warm dry intact with no signs of infection or rash on extremities or on axial skeleton.  Abdomen: Soft nontender  Neuro: Cranial nerves II through XII are intact, neurovascularly intact in all extremities with 2+ DTRs and 2+ pulses.  Lymph: No lymphadenopathy of posterior or anterior cervical chain or axillae bilaterally.  Gait normal with good balance and coordination.  MSK:  Non tender with full range of motion and good stability and symmetric strength and tone of shoulders, elbows, wrist, hip, knee and ankles bilaterally.  Patient does have some mild lower doses.  Patient does have tenderness over the right sacroiliac joint.  Negative straight leg test.  Positive FABER test.  Minimal tenderness otherwise.  Near full range of motion.  Osteopathic findings T5 extended rotated and side bent right  L2 flexed rotated and side bent right Sacrum right on right     Impression and Recommendations:     This case required medical decision making of moderate complexity. The above documentation has been reviewed and is accurate and complete Lyndal Pulley, DO       Note: This dictation was prepared with Dragon dictation along with smaller phrase technology. Any transcriptional errors that result from this process are unintentional.

## 2019-08-18 NOTE — Patient Instructions (Signed)
Good to see you.  Ice 20 minutes 2 times daily. Usually after activity and before bed. Exercises 3 times a week.  Turmeric 500mg  daily  Tart cherry extract 1200mg  at night Vitamin D 2000 IU daily  See me again in 4 weeks

## 2019-08-19 ENCOUNTER — Encounter: Payer: Self-pay | Admitting: Family Medicine

## 2019-08-19 DIAGNOSIS — M533 Sacrococcygeal disorders, not elsewhere classified: Secondary | ICD-10-CM | POA: Insufficient documentation

## 2019-08-19 DIAGNOSIS — M999 Biomechanical lesion, unspecified: Secondary | ICD-10-CM | POA: Insufficient documentation

## 2019-08-19 NOTE — Assessment & Plan Note (Signed)
Patient has more of a sacroiliac dysfunction.  Likely secondary to the repetitive picking up of a child.  Possible ligamentous injury years back.  This causes some mild instability.  Discussed posture and ergonomics, which activities to do which wants to avoid.  Follow-up again in 4 to 8 weeks responding well to manipulation, home exercises given and discussed over-the-counter medications.

## 2019-08-19 NOTE — Assessment & Plan Note (Signed)
Decision today to treat with OMT was based on Physical Exam  After verbal consent patient was treated with HVLA, ME, FPR techniques in  thoracic, lumbar and sacral areas  Patient tolerated the procedure well with improvement in symptoms  Patient given exercises, stretches and lifestyle modifications  See medications in patient instructions if given  Patient will follow up in 4-8 weeks 

## 2019-09-16 ENCOUNTER — Other Ambulatory Visit: Payer: Self-pay

## 2019-09-16 ENCOUNTER — Encounter: Payer: Self-pay | Admitting: Family Medicine

## 2019-09-16 ENCOUNTER — Ambulatory Visit (INDEPENDENT_AMBULATORY_CARE_PROVIDER_SITE_OTHER): Payer: BC Managed Care – PPO | Admitting: Family Medicine

## 2019-09-16 VITALS — BP 100/80 | HR 66 | Ht 72.0 in | Wt 214.0 lb

## 2019-09-16 DIAGNOSIS — M533 Sacrococcygeal disorders, not elsewhere classified: Secondary | ICD-10-CM | POA: Diagnosis not present

## 2019-09-16 DIAGNOSIS — M999 Biomechanical lesion, unspecified: Secondary | ICD-10-CM | POA: Diagnosis not present

## 2019-09-16 NOTE — Progress Notes (Signed)
[place] Phone: [phone] Subjective:   I Brandi Henderson am serving as a Education administrator for Dr. Hulan Saas.  This visit occurred during the SARS-CoV-2 public health emergency.  Safety protocols were in place, including screening questions prior to the visit, additional usage of staff PPE, and extensive cleaning of exam room while observing appropriate contact time as indicated for disinfecting solutions.   I'm seeing this patient by the request  of:  Billie Ruddy, MD  CC: Back pain follow-up  [id number]  Brandi Henderson is a 39 y.o. female coming in with complaint of back pain. Last seen on 08/18/2019 for OMT. Patient states she feels great. Has noticed a lot of improvement.    Previous x-rays from July 15, 2019 by outside source were independently visualized by me showing no significant bony abnormality.  Past Medical History:  Diagnosis Date  . Allergy   . 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 Hx of varicella   . IUD (intrauterine device) in place 2014   copper   . LGA (large for gestational age) fetus affecting mother, antepartum 03/13/2016  . Meralgia paresthetica of right side 2015  . Routine gynecological examination    Dr. Orvan Seen, [facility]  . Wears glasses    Past Surgical History:  Procedure Laterality Date  . CYST REMOVAL HAND     ganglion, right  . WISDOM TOOTH EXTRACTION     Social History   Socioeconomic History  . Marital status: Married    Spouse name: Not on file  . Number of children: Not on file  . Years of education: Not on file  . Highest education level: Not on file  Occupational History  . Not on file  Tobacco Use  . Smoking status: Never Smoker  . Smokeless tobacco: Never Used  Substance and Sexual Activity  . Alcohol use: Yes    Alcohol/week: 2.0 standard drinks    Types: 1 Glasses of wine, 1 Cans of beer per week    . Drug use: No  . Sexual activity: Yes    Birth control/protection: I.U.D.  Other Topics Concern  . Not on file  Social History Narrative   Exercises 4 days per week with walking, running, biking.  Married, has 76mo son as of 8/15.   Moved from [place], [place].  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 [employer]   Social Determinants of Health   Financial Resource Strain:   . Difficulty of Paying Living Expenses: Not on file  Food Insecurity:   . Worried About Charity fundraiser in the Last Year: Not on file  . Ran Out of Food in the Last Year: Not on file  Transportation Needs:   . Lack of Transportation (Medical): Not on file  . Lack of Transportation (Non-Medical): Not on file  Physical Activity:   . Days of Exercise per Week: Not on file  . Minutes of Exercise per Session: Not on file  Stress:   . Feeling of Stress : Not on file  Social Connections:   . Frequency of Communication with Friends and Family: Not on file  . Frequency of Social Gatherings with Friends and Family: Not on file  . Attends Religious Services: Not on file  . Active Member of Clubs or Organizations: Not on file  . Attends Archivist Meetings: Not on file  . Marital Status: Not on file  Allergies  Allergen Reactions  . Estrogens     Birth control pills, erythema nodosum   Family History  Problem Relation Age of Onset  . Crohn's disease Brother   . Macular degeneration Father   . Cancer Maternal Grandfather   . Diabetes Paternal Grandmother   . Heart disease Paternal Grandmother   . Macular degeneration Paternal Grandmother   . Stroke Neg Hx   . Hypertension Neg Hx   . Hyperlipidemia Neg Hx          Current Outpatient Medications (Other):  Marland Kitchen  Multiple Vitamin (MULTIVITAMIN) tablet, Take 1 tablet by mouth daily.   Reviewed prior external information including notes and imaging from  primary care provider As well as notes that were available from care everywhere and other healthcare  systems.  Past medical history, social, surgical and family history all reviewed in electronic medical record.  No pertanent information unless stated regarding to the chief complaint.   Review of Systems:  No headache, visual changes, nausea, vomiting, diarrhea, constipation, dizziness, abdominal pain, skin rash, fevers, chills, night sweats, weight loss, swollen lymph nodes, body aches, joint swelling, chest pain, shortness of breath, mood changes. POSITIVE muscle aches  Objective  Blood pressure 100/80, pulse 66, height 6' (1.829 m), weight 214 lb (97.1 kg), SpO2 98 %.   General: No apparent distress alert and oriented x3 mood and affect normal, dressed appropriately.  HEENT: Pupils equal, extraocular movements intact  Respiratory: Patient's speak in full sentences and does not appear short of breath  Cardiovascular: No lower extremity edema, non tender, no erythema  Skin: Warm dry intact with no signs of infection or rash on extremities or on axial skeleton.  Abdomen: Soft nontender  Neuro: Cranial nerves II through XII are intact, neurovascularly intact in all extremities with 2+ DTRs and 2+ pulses.  Lymph: No lymphadenopathy of posterior or anterior cervical chain or axillae bilaterally.  Gait normal with good balance and coordination.  MSK:  Non tender with full range of motion and good stability and symmetric strength and tone of shoulders, elbows, wrist, hip, knee and ankles bilaterally.  Back Exam:  Inspection: Unremarkable  Motion: Flexion 40 deg, Extension 25 deg, Side Bending to 35 deg bilaterally,  Rotation to 45 deg bilaterally  SLR laying: Negative  XSLR laying: Negative  Palpable tenderness: Tender to palpation over the paraspinal musculature lumbar spine right greater than left. FABER: positive right  Sensory change: Gross sensation intact to all lumbar and sacral dermatomes.  Reflexes: 2+ at both patellar tendons, 2+ at achilles tendons, Babinski's downgoing.    Strength at foot  Plantar-flexion: 5/5 Dorsi-flexion: 5/5 Eversion: 5/5 Inversion: 5/5  Leg strength  Quad: 5/5 Hamstring: 5/5 Hip flexor: 5/5 Hip abductors: 5/5  Gait unremarkable.  Osteopathic findings C2 flexed rotated and side bent right C6 flexed rotated and side bent left T3 extended rotated and side bent right inhaled third rib T6 extended rotated and side bent left L3 flexed rotated and side bent right Sacrum right on right    Impression and Recommendations:     This case required medical decision making of moderate complexity. The above documentation has been reviewed and is accurate and complete Lyndal Pulley, DO       Note: This dictation was prepared with Dragon dictation along with smaller phrase technology. Any transcriptional errors that result from this process are unintentional.

## 2019-09-16 NOTE — Assessment & Plan Note (Signed)
Decision today to treat with OMT was based on Physical Exam  After verbal consent patient was treated with HVLA, ME, FPR techniques in  thoracic, lumbar and sacral areas  Patient tolerated the procedure well with improvement in symptoms  Patient given exercises, stretches and lifestyle modifications  See medications in patient instructions if given  Patient will follow up in 4-8 weeks 

## 2019-09-16 NOTE — Patient Instructions (Signed)
Hoka arahi Brooks adrenaline  [employer] orthotics "total support" See me again in 6 -7 weeks

## 2019-09-16 NOTE — Assessment & Plan Note (Signed)
Stable overall but will be chronic.  Discussed posture and ergonomics and core strengthening exercises.  We discussed the importance of weight loss.  We went over the external images again as well.  Discussed medical management including drug management.  Patient is to increase activity as tolerated.  To have some difficulty with the coronavirus and social determinants that could change treatment.  Follow-up with me again 6 to 8 weeks

## 2019-09-18 ENCOUNTER — Ambulatory Visit: Payer: BC Managed Care – PPO | Attending: Internal Medicine

## 2019-09-18 DIAGNOSIS — Z20822 Contact with and (suspected) exposure to covid-19: Secondary | ICD-10-CM | POA: Diagnosis not present

## 2019-09-19 LAB — NOVEL CORONAVIRUS, NAA: SARS-CoV-2, NAA: NOT DETECTED

## 2019-10-15 ENCOUNTER — Ambulatory Visit: Payer: BC Managed Care – PPO | Attending: Family

## 2019-10-15 ENCOUNTER — Other Ambulatory Visit: Payer: Self-pay

## 2019-10-15 DIAGNOSIS — Z23 Encounter for immunization: Secondary | ICD-10-CM | POA: Insufficient documentation

## 2019-10-15 NOTE — Progress Notes (Signed)
   Covid-19 Vaccination Clinic  Name:  Brandi Henderson    MRN: [id number] DOB: 1980-08-25  10/15/2019  Brandi Henderson was observed post Covid-19 immunization for 15 minutes without incidence. She was provided with Vaccine Information Sheet and instruction to access the V-Safe system.   Brandi Henderson was instructed to call 911 with any severe reactions post vaccine: Marland Kitchen Difficulty breathing  . Swelling of your face and throat  . A fast heartbeat  . A bad rash all over your body  . Dizziness and weakness    Immunizations Administered    Name Date Dose VIS Date Route   Moderna COVID-19 Vaccine 10/15/2019  3:23 PM 0.5 mL 07/21/2019 Intramuscular   Manufacturer: Moderna   [id number]   [id number]

## 2019-10-29 ENCOUNTER — Ambulatory Visit (INDEPENDENT_AMBULATORY_CARE_PROVIDER_SITE_OTHER): Payer: BC Managed Care – PPO | Admitting: Family Medicine

## 2019-10-29 ENCOUNTER — Other Ambulatory Visit: Payer: Self-pay

## 2019-10-29 ENCOUNTER — Encounter: Payer: Self-pay | Admitting: Family Medicine

## 2019-10-29 VITALS — BP 104/72 | HR 57 | Ht 72.0 in | Wt 214.0 lb

## 2019-10-29 DIAGNOSIS — M999 Biomechanical lesion, unspecified: Secondary | ICD-10-CM

## 2019-10-29 DIAGNOSIS — M533 Sacrococcygeal disorders, not elsewhere classified: Secondary | ICD-10-CM

## 2019-10-29 NOTE — Assessment & Plan Note (Signed)
Decision today to treat with OMT was based on Physical Exam  After verbal consent patient was treated with HVLA, ME, FPR techniques in  thoracic, lumbar and sacral areas  Patient tolerated the procedure well with improvement in symptoms  Patient given exercises, stretches and lifestyle modifications  See medications in patient instructions if given  Patient will follow up in 4-8 weeks 

## 2019-10-29 NOTE — Assessment & Plan Note (Signed)
Chronic problem but making some small improvement.  Discussed with patient about icing regimen, home exercise, which activities to do which was to avoid.  Patient will increase activity slowly over the course the next several days.  Encourage patient to continue to work on hip abductor strengthening.  Follow-up with me in 4 to 8 weeks

## 2019-10-29 NOTE — Progress Notes (Signed)
[place] [facility] Phone: [phone] Subjective:   [name], am serving as a scribe for Dr. Hulan Saas. This visit occurred during the SARS-CoV-2 public health emergency.  Safety protocols were in place, including screening questions prior to the visit, additional usage of staff PPE, and extensive cleaning of exam room while observing appropriate contact time as indicated for disinfecting solutions.   I'm seeing this patient by the request  of:  Koberlein, Steele Berg, MD  CC: Low back pain  [id number]   Brandi Henderson is a 39 y.o. female coming in with complaint of back pain. Last seen on 09/16/2019 for OMT. Patient states that she having tightness in her lower back. Has not been stretching as much as she knows she should. Also having pain in thoracic spine which she attributes to stress.      Past Medical History:  Diagnosis Date  . Allergy   . 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 Hx of varicella   . IUD (intrauterine device) in place 2014   copper   . LGA (large for gestational age) fetus affecting mother, antepartum 03/13/2016  . Meralgia paresthetica of right side 2015  . Routine gynecological examination    Dr. Orvan Seen, [facility]  . Wears glasses    Past Surgical History:  Procedure Laterality Date  . CYST REMOVAL HAND     ganglion, right  . WISDOM TOOTH EXTRACTION     Social History   Socioeconomic History  . Marital status: Married    Spouse name: Not on file  . Number of children: Not on file  . Years of education: Not on file  . Highest education level: Not on file  Occupational History  . Not on file  Tobacco Use  . Smoking status: Never Smoker  . Smokeless tobacco: Never Used  Substance and Sexual Activity  . Alcohol use: Yes    Alcohol/week: 2.0 standard drinks    Types: 1 Glasses of wine, 1 Cans of beer per week  . Drug use: No    . Sexual activity: Yes    Birth control/protection: I.U.D.  Other Topics Concern  . Not on file  Social History Narrative   Exercises 4 days per week with walking, running, biking.  Married, has 66mo son as of 8/15.   Moved from [place], [place].  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 [employer]   Social Determinants of Radio broadcast assistant Strain:   . Difficulty of Paying Living Expenses:   Food Insecurity:   . Worried About Charity fundraiser in the Last Year:   . Arboriculturist in the Last Year:   Transportation Needs:   . Film/video editor (Medical):   Marland Kitchen Lack of Transportation (Non-Medical):   Physical Activity:   . Days of Exercise per Week:   . Minutes of Exercise per Session:   Stress:   . Feeling of Stress :   Social Connections:   . Frequency of Communication with Friends and Family:   . Frequency of Social Gatherings with Friends and Family:   . Attends Religious Services:   . Active Member of Clubs or Organizations:   . Attends Archivist Meetings:   Marland Kitchen Marital Status:    Allergies  Allergen Reactions  . Estrogens     Birth control pills, erythema nodosum   Family History  Problem Relation Age of  Onset  . Crohn's disease Brother   . Macular degeneration Father   . Cancer Maternal Grandfather   . Diabetes Paternal Grandmother   . Heart disease Paternal Grandmother   . Macular degeneration Paternal Grandmother   . Stroke Neg Hx   . Hypertension Neg Hx   . Hyperlipidemia Neg Hx          Current Outpatient Medications (Other):  Marland Kitchen  Multiple Vitamin (MULTIVITAMIN) tablet, Take 1 tablet by mouth daily.   Reviewed prior external information including notes and imaging from  primary care provider As well as notes that were available from care everywhere and other healthcare systems.  Past medical history, social, surgical and family history all reviewed in electronic medical record.  No pertanent information unless stated regarding to the  chief complaint.   Review of Systems:  No headache, visual changes, nausea, vomiting, diarrhea, constipation, dizziness, abdominal pain, skin rash, fevers, chills, night sweats, weight loss, swollen lymph nodes, body aches, joint swelling, chest pain, shortness of breath, mood changes. POSITIVE muscle aches  Objective  Blood pressure 104/72, pulse (!) 57, height 6' (1.829 m), weight 214 lb (97.1 kg), SpO2 99 %.   General: No apparent distress alert and oriented x3 mood and affect normal, dressed appropriately.  HEENT: Pupils equal, extraocular movements intact  Respiratory: Patient's speak in full sentences and does not appear short of breath  Cardiovascular: No lower extremity edema, non tender, no erythema  Skin: Warm dry intact with no signs of infection or rash on extremities or on axial skeleton.  Abdomen: Soft nontender  Neuro: Cranial nerves II through XII are intact, neurovascularly intact in all extremities with 2+ DTRs and 2+ pulses.  Lymph: No lymphadenopathy of posterior or anterior cervical chain or axillae bilaterally.  Gait normal with good balance and coordination.  MSK:  Non tender with full range of motion and good stability and symmetric strength and tone of shoulders, elbows, wrist, hip, knee and ankles bilaterally.  Hypermobility noted  Low back exam shows some tenderness over the right sacroiliac joint but very minimal.  More pain now in the thoracolumbar juncture more left greater than right.  No spinous process tenderness.  5 out of 5 strength in lower extremities distally.  Osteopathic findings  T11 extended rotated and side bent left L2 flexed rotated and side bent right Sacrum right on right     Impression and Recommendations:     This case required medical decision making of moderate complexity. The above documentation has been reviewed and is accurate and complete Lyndal Pulley, DO       Note: This dictation was prepared with Dragon dictation along  with smaller phrase technology. Any transcriptional errors that result from this process are unintentional.

## 2019-10-29 NOTE — Patient Instructions (Signed)
Awesome overall See me in 8 weeks

## 2019-11-17 ENCOUNTER — Ambulatory Visit: Payer: BC Managed Care – PPO | Attending: Family

## 2019-11-17 DIAGNOSIS — Z23 Encounter for immunization: Secondary | ICD-10-CM

## 2019-11-17 NOTE — Progress Notes (Signed)
   Covid-19 Vaccination Clinic  Name:  Brandi Henderson    MRN: [id number] DOB: 1980-09-01  11/17/2019  [name] was observed post Covid-19 immunization for 15 minutes without incident. She was provided with Vaccine Information Sheet and instruction to access the V-Safe system.   [name] was instructed to call 911 with any severe reactions post vaccine: Marland Kitchen Difficulty breathing  . Swelling of face and throat  . A fast heartbeat  . A bad rash all over body  . Dizziness and weakness   Immunizations Administered    Name Date Dose VIS Date Route   Moderna COVID-19 Vaccine 11/17/2019  9:42 AM 0.5 mL 07/21/2019 Intramuscular   Manufacturer: Moderna   [id number]   [id number]

## 2019-12-30 ENCOUNTER — Ambulatory Visit: Payer: BC Managed Care – PPO | Admitting: Family Medicine

## 2020-01-12 ENCOUNTER — Ambulatory Visit (INDEPENDENT_AMBULATORY_CARE_PROVIDER_SITE_OTHER): Payer: BC Managed Care – PPO | Admitting: Family Medicine

## 2020-01-12 ENCOUNTER — Encounter: Payer: Self-pay | Admitting: Family Medicine

## 2020-01-12 ENCOUNTER — Other Ambulatory Visit: Payer: Self-pay

## 2020-01-12 VITALS — BP 114/76 | HR 75 | Ht 72.0 in | Wt 211.0 lb

## 2020-01-12 DIAGNOSIS — M533 Sacrococcygeal disorders, not elsewhere classified: Secondary | ICD-10-CM | POA: Diagnosis not present

## 2020-01-12 DIAGNOSIS — M999 Biomechanical lesion, unspecified: Secondary | ICD-10-CM | POA: Diagnosis not present

## 2020-01-12 NOTE — Assessment & Plan Note (Signed)
Patient seems stable at the moment. Has made significant progress. Responding well to manipulation. Space out to 10-21 for week intervals. Taking no medications at the moment.

## 2020-01-12 NOTE — Progress Notes (Signed)
[facility] [address] [place] Phone: [phone] Subjective:   I Brandi Henderson am serving as a Education administrator for Dr. Hulan Saas.  This visit occurred during the SARS-CoV-2 public health emergency.  Safety protocols were in place, including screening questions prior to the visit, additional usage of staff PPE, and extensive cleaning of exam room while observing appropriate contact time as indicated for disinfecting solutions.   I'm seeing this patient by the request  of:  Koberlein, Steele Berg, MD  CC: Low back pain  [id number]  Brandi Henderson is a 39 y.o. female coming in with complaint of SI joint pain. Last seen 10/29/2019 for OMT. Patient states she is doing well today.  Discussed HEP, doing very well at this time from activity.  Still mostly in the lower back.     Past Medical History:  Diagnosis Date  . Allergy   . 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 Hx of varicella   . IUD (intrauterine device) in place 2014   copper   . LGA (large for gestational age) fetus affecting mother, antepartum 03/13/2016  . Meralgia paresthetica of right side 2015  . Routine gynecological examination    Dr. Orvan Seen, [facility]  . Wears glasses    Past Surgical History:  Procedure Laterality Date  . CYST REMOVAL HAND     ganglion, right  . WISDOM TOOTH EXTRACTION     Social History   Socioeconomic History  . Marital status: Married    Spouse name: Not on file  . Number of children: Not on file  . Years of education: Not on file  . Highest education level: Not on file  Occupational History  . Not on file  Tobacco Use  . Smoking status: Never Smoker  . Smokeless tobacco: Never Used  Substance and Sexual Activity  . Alcohol use: Yes    Alcohol/week: 2.0 standard drinks    Types: 1 Glasses of wine, 1 Cans of beer per week  . Drug use: No  . Sexual activity: Yes    Birth  control/protection: I.U.D.  Other Topics Concern  . Not on file  Social History Narrative   Exercises 4 days per week with walking, running, biking.  Married, has 17mo son as of 8/15.   Moved from [place], [place].  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 Jewish   Social Determinants of Radio broadcast assistant Strain:   . Difficulty of Paying Living Expenses:   Food Insecurity:   . Worried About Charity fundraiser in the Last Year:   . Arboriculturist in the Last Year:   Transportation Needs:   . Film/video editor (Medical):   Marland Kitchen Lack of Transportation (Non-Medical):   Physical Activity:   . Days of Exercise per Week:   . Minutes of Exercise per Session:   Stress:   . Feeling of Stress :   Social Connections:   . Frequency of Communication with Friends and Family:   . Frequency of Social Gatherings with Friends and Family:   . Attends Religious Services:   . Active Member of Clubs or Organizations:   . Attends Archivist Meetings:   Marland Kitchen Marital Status:    Allergies  Allergen Reactions  . Estrogens     Birth control pills, erythema nodosum   Family History  Problem Relation Age of Onset  . Crohn's disease Brother   .  Macular degeneration Father   . Cancer Maternal Grandfather   . Diabetes Paternal Grandmother   . Heart disease Paternal Grandmother   . Macular degeneration Paternal Grandmother   . Stroke Neg Hx   . Hypertension Neg Hx   . Hyperlipidemia Neg Hx          Current Outpatient Medications (Other):  Marland Kitchen  Multiple Vitamin (MULTIVITAMIN) tablet, Take 1 tablet by mouth daily.   Reviewed prior external information including notes and imaging from  primary care provider As well as notes that were available from care everywhere and other healthcare systems.  Past medical history, social, surgical and family history all reviewed in electronic medical record.  No pertanent information unless stated regarding to the chief complaint.   Review of  Systems:  No headache, visual changes, nausea, vomiting, diarrhea, constipation, dizziness, abdominal pain, skin rash, fevers, chills, night sweats, weight loss, swollen lymph nodes, body aches, joint swelling, chest pain, shortness of breath, mood changes. POSITIVE muscle aches  Objective  Blood pressure 114/76, pulse 75, height 6' (1.829 m), weight 211 lb (95.7 kg), SpO2 98 %.   General: No apparent distress alert and oriented x3 mood and affect normal, dressed appropriately.  HEENT: Pupils equal, extraocular movements intact  Respiratory: Patient's speak in full sentences and does not appear short of breath  Cardiovascular: No lower extremity edema, non tender, no erythema  Neuro: Cranial nerves II through XII are intact, neurovascularly intact in all extremities with 2+ DTRs and 2+ pulses.  Gait normal with good balance and coordination.  MSK:  Non tender with full range of motion and good stability and symmetric strength and tone of shoulders, elbows, wrist, hip, knee and ankles bilaterally.  Tenderness to palpation in the paraspinal musculature lumbar spine right than left.  With straight leg test increase in tightness on the right side.  No weakness in the lower extremities.  Lacks last 5 degrees of extension  Osteopathic findings  T9 extended rotated and side bent left L2 flexed rotated and side bent right Sacrum right on right    Impression and Recommendations:      The above documentation has been reviewed and is accurate and complete Lyndal Pulley, DO       Note: This dictation was prepared with Dragon dictation along with smaller phrase technology. Any transcriptional errors that result from this process are unintentional.

## 2020-01-12 NOTE — Assessment & Plan Note (Signed)

## 2020-01-12 NOTE — Patient Instructions (Signed)
Good to see you Awesome overall See me again in 10 weeks

## 2020-02-03 ENCOUNTER — Ambulatory Visit (INDEPENDENT_AMBULATORY_CARE_PROVIDER_SITE_OTHER): Payer: BC Managed Care – PPO | Admitting: Family Medicine

## 2020-02-03 ENCOUNTER — Encounter: Payer: Self-pay | Admitting: Family Medicine

## 2020-02-03 ENCOUNTER — Other Ambulatory Visit: Payer: Self-pay

## 2020-02-03 VITALS — BP 100/78 | HR 58 | Ht 72.0 in | Wt 211.0 lb

## 2020-02-03 DIAGNOSIS — M999 Biomechanical lesion, unspecified: Secondary | ICD-10-CM

## 2020-02-03 DIAGNOSIS — M533 Sacrococcygeal disorders, not elsewhere classified: Secondary | ICD-10-CM | POA: Diagnosis not present

## 2020-02-03 NOTE — Assessment & Plan Note (Signed)

## 2020-02-03 NOTE — Patient Instructions (Addendum)
Good to see you When in pain 3 ibuprofen 3 times a day for 3 days Good luck with the garden See me again on your scheduled appointment

## 2020-02-03 NOTE — Progress Notes (Signed)
[facility] [address] [place] Phone: [phone] Subjective:   I Brandi Henderson am serving as a Education administrator for Dr. Hulan Saas.  This visit occurred during the SARS-CoV-2 public health emergency.  Safety protocols were in place, including screening questions prior to the visit, additional usage of staff PPE, and extensive cleaning of exam room while observing appropriate contact time as indicated for disinfecting solutions.   I'm seeing this patient by the request  of:  Koberlein, Steele Berg, MD  CC: Low back pain follow-up  [id number]  Brandi Henderson is a 39 y.o. female coming in with complaint of back and neck pain. OMT 01/12/2020. Patient states she is not doing well. Feeling worse than last time. States that she flew to [place] to help her parents driver their cars back to [place]. States she has been sitting a lot and it has caused pain.   Medications patient has been prescribed: Been taking ibuprofen regularly twice a day.       Reviewed prior external information including notes and imaging from previsou exam, outside providers and external EMR if available.   As well as notes that were available from care everywhere and other healthcare systems.  Past medical history, social, surgical and family history all reviewed in electronic medical record.  No pertanent information unless stated regarding to the chief complaint.   Past Medical History:  Diagnosis Date  . Allergy   . 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 Hx of varicella   . IUD (intrauterine device) in place 2014   copper   . LGA (large for gestational age) fetus affecting mother, antepartum 03/13/2016  . Meralgia paresthetica of right side 2015  . Routine gynecological examination    Dr. Orvan Seen, [facility]  . Wears glasses     Allergies  Allergen Reactions  . Estrogens     Birth control pills, erythema nodosum      Review of Systems:  No headache, visual changes, nausea, vomiting, diarrhea, constipation, dizziness, abdominal pain, skin rash, fevers, chills, night sweats, weight loss, swollen lymph nodes, body aches, joint swelling, chest pain, shortness of breath, mood changes. POSITIVE muscle aches  Objective  Blood pressure 100/78, pulse (!) 58, height 6' (1.829 m), weight 211 lb (95.7 kg), SpO2 98 %.   General: No apparent distress alert and oriented x3 mood and affect normal, dressed appropriately.  HEENT: Pupils equal, extraocular movements intact  Respiratory: Patient's speak in full sentences and does not appear short of breath  Cardiovascular: No lower extremity edema, non tender, no erythema  Neuro: Cranial nerves II through XII are intact, neurovascularly intact in all extremities with 2+ DTRs and 2+ pulses.  Gait normal with good balance and coordination.  MSK:  Non tender with full range of motion and good stability and symmetric strength and tone of shoulders, elbows, wrist, hip, knee and ankles bilaterally.  Back - Normal skin, Spine with normal alignment and no deformity.  No tenderness to vertebral process palpation.  Paraspinous muscles are not tender and without spasm.   Range of motion is full at neck and lumbar sacral regions  Osteopathic findings  C2 flexed rotated and side bent right T11 extended rotated and side bent left L2 flexed rotated and side bent right Sacrum right on right       Assessment and Plan:        The above documentation has been reviewed and is accurate  and complete Lyndal Pulley, DO       Note: This dictation was prepared with Dragon dictation along with smaller phrase technology. Any transcriptional errors that result from this process are unintentional.

## 2020-02-03 NOTE — Assessment & Plan Note (Signed)
Chronic problem with exacerbation.  Patient still wants to avoid any type of prescription medications.  Discussed the ibuprofen and how this could be beneficial.  Spine is well to manipulation and she will keep the same date for her follow-up.

## 2020-03-22 ENCOUNTER — Other Ambulatory Visit: Payer: Self-pay

## 2020-03-22 ENCOUNTER — Encounter: Payer: Self-pay | Admitting: Family Medicine

## 2020-03-22 ENCOUNTER — Ambulatory Visit (INDEPENDENT_AMBULATORY_CARE_PROVIDER_SITE_OTHER): Payer: BC Managed Care – PPO | Admitting: Family Medicine

## 2020-03-22 VITALS — BP 122/82 | HR 90 | Ht 72.0 in | Wt 211.0 lb

## 2020-03-22 DIAGNOSIS — M999 Biomechanical lesion, unspecified: Secondary | ICD-10-CM

## 2020-03-22 DIAGNOSIS — M533 Sacrococcygeal disorders, not elsewhere classified: Secondary | ICD-10-CM

## 2020-03-22 NOTE — Patient Instructions (Signed)
See me in 6-7 weeks 

## 2020-03-22 NOTE — Progress Notes (Signed)
[place] Phone: [phone] Subjective:   [name], am serving as a scribe for Dr. Hulan Saas. This visit occurred during the SARS-CoV-2 public health emergency.  Safety protocols were in place, including screening questions prior to the visit, additional usage of staff PPE, and extensive cleaning of exam room while observing appropriate contact time as indicated for disinfecting solutions.   I'm seeing this patient by the request  of:  Koberlein, Steele Berg, MD  CC: Back and neck pain follow-up  [id number]  Mersedes Alber is a 39 y.o. female coming in with complaint of back and neck pain. OMT 02/03/2020. Patient states has been doing relatively well.  Had one flare but since then has only had to take ibuprofen 1 time.  Nothing severe at the moment.         Reviewed prior external information including notes and imaging from previsou exam, outside providers and external EMR if available.   As well as notes that were available from care everywhere and other healthcare systems.  Past medical history, social, surgical and family history all reviewed in electronic medical record.  No pertanent information unless stated regarding to the chief complaint.   Past Medical History:  Diagnosis Date  . Allergy   . 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 Hx of varicella   . IUD (intrauterine device) in place 2014   copper   . LGA (large for gestational age) fetus affecting mother, antepartum 03/13/2016  . Meralgia paresthetica of right side 2015  . Routine gynecological examination    Dr. Orvan Seen, [facility]  . Wears glasses     Allergies  Allergen Reactions  . Estrogens     Birth control pills, erythema nodosum     Review of Systems:  No headache, visual changes, nausea, vomiting, diarrhea, constipation, dizziness, abdominal pain, skin rash, fevers,  chills, night sweats, weight loss, swollen lymph nodes, body aches, joint swelling, chest pain, shortness of breath, mood changes. POSITIVE muscle aches  Objective  Blood pressure 122/82, pulse 90, height 6' (1.829 m), weight 211 lb (95.7 kg), SpO2 99 %.   General: No apparent distress alert and oriented x3 mood and affect normal, dressed appropriately.  HEENT: Pupils equal, extraocular movements intact  Respiratory: Patient's speak in full sentences and does not appear short of breath  Cardiovascular: No lower extremity edema, non tender, no erythema  Neuro: Cranial nerves II through XII are intact, neurovascularly intact in all extremities with 2+ DTRs and 2+ pulses.  Gait normal with good balance and coordination.  MSK:  Non tender with full range of motion and good stability and symmetric strength and tone of shoulders, elbows, wrist, hip, knee and ankles bilaterally.  Back - Normal skin, Spine with normal alignment and no deformity.  No tenderness to vertebral process palpation.  Paraspinous muscles are not tender and without spasm.   Range of motion is full at neck and lumbar sacral regions  Osteopathic findings   T7 extended rotated and side bent left L4 flexed rotated and side bent left Sacrum right on right       Assessment and Plan:    Nonallopathic problems  Decision today to treat with OMT was based on Physical Exam  After verbal consent patient was treated with HVLA, ME, FPR techniques in  thoracic, lumbar, and sacral  areas  Patient tolerated the procedure well with improvement in symptoms  Patient given exercises, stretches and lifestyle modifications  See medications in patient instructions if given  Patient will follow up in 4-8 weeks      The above documentation has been reviewed and is accurate and complete Lyndal Pulley, DO       Note: This dictation was prepared with Dragon dictation along with smaller phrase technology. Any transcriptional  errors that result from this process are unintentional.

## 2020-03-22 NOTE — Assessment & Plan Note (Signed)
Stable doing significantly well.  Discussed the potential follow-up in 2 to 3 months.  No significant change otherwise.  Patient has made great strides and is doing terrific

## 2020-04-13 DIAGNOSIS — Z20822 Contact with and (suspected) exposure to covid-19: Secondary | ICD-10-CM | POA: Diagnosis not present

## 2020-04-18 DIAGNOSIS — R05 Cough: Secondary | ICD-10-CM | POA: Diagnosis not present

## 2020-04-18 DIAGNOSIS — Z20828 Contact with and (suspected) exposure to other viral communicable diseases: Secondary | ICD-10-CM | POA: Diagnosis not present

## 2020-04-19 DIAGNOSIS — R05 Cough: Secondary | ICD-10-CM | POA: Diagnosis not present

## 2020-04-19 DIAGNOSIS — Z20828 Contact with and (suspected) exposure to other viral communicable diseases: Secondary | ICD-10-CM | POA: Diagnosis not present

## 2020-05-09 NOTE — Progress Notes (Signed)
[place] Phone: [phone] Subjective:   Brandi Henderson, am serving as a scribe for Dr. Hulan Saas. This visit occurred during the SARS-CoV-2 public health emergency.  Safety protocols were in place, including screening questions prior to the visit, additional usage of staff PPE, and extensive cleaning of exam room while observing appropriate contact time as indicated for disinfecting solutions.   I'm seeing this patient by the request  of:  Koberlein, Steele Berg, MD  CC: Low back pain follow-up  [id number]  Brandi Henderson is a 39 y.o. female coming in with complaint of back and neck pain. OMT 03/22/2020. Patient states has been doing remarkably well.  Patient states Henderson issues since last exam.  Patient has been very active.  Patient is looking forward to the beach.  Medications patient has been prescribed: None         Reviewed prior external information including notes and imaging from previsou exam, outside providers and external EMR if available.   As well as notes that were available from care everywhere and other healthcare systems.  Past medical history, social, surgical and family history all reviewed in electronic medical record.  Henderson pertanent information unless stated regarding to the chief complaint.   Past Medical History:  Diagnosis Date  . Allergy   . 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 Hx of varicella   . IUD (intrauterine device) in place 2014   copper   . LGA (large for gestational age) fetus affecting mother, antepartum 03/13/2016  . Meralgia paresthetica of right side 2015  . Routine gynecological examination    Dr. Orvan Seen, [facility]  . Wears glasses     Allergies  Allergen Reactions  . Estrogens     Birth control pills, erythema nodosum     Review of Systems:  Henderson headache, visual changes, nausea, vomiting, diarrhea,  constipation, dizziness, abdominal pain, skin rash, fevers, chills, night sweats, weight loss, swollen lymph nodes, body aches, joint swelling, chest pain, shortness of breath, mood changes. POSITIVE muscle aches  Objective  Blood pressure 122/84, pulse 90, height 6' (1.829 m), weight 211 lb (95.7 kg), SpO2 99 %.   General: Henderson apparent distress alert and oriented x3 mood and affect normal, dressed appropriately.  HEENT: Pupils equal, extraocular movements intact  Respiratory: Patient's speak in full sentences and does not appear short of breath  Cardiovascular: Henderson lower extremity edema, non tender, Henderson erythema  Neuro: Cranial nerves II through XII are intact, neurovascularly intact in all extremities with 2+ DTRs and 2+ pulses.  Gait normal with good balance and coordination.  MSK:  Non tender with full range of motion and good stability and symmetric strength and tone of shoulders, elbows, wrist, hip, knee and ankles bilaterally.  Back - Normal skin, Spine with normal alignment and Henderson deformity.  Henderson tenderness to vertebral process palpation.  Paraspinous muscles are not tender and without spasm.   Range of motion is full at neck and lumbar sacral regions  Osteopathic findings   T9 extended rotated and side bent left L2 flexed rotated and side bent right Sacrum right on right       Assessment and Plan:  SI (sacroiliac) joint dysfunction Patient has done remarkably well and is continuing to improve every time we have seen patient.  We discussed icing regimen and home exercise, which activities to do which wants to avoid.  Patient is  to increase activity slowly.  Follow-up with me again in 12 weeks.   Nonallopathic problems  Decision today to treat with OMT was based on Physical Exam  After verbal consent patient was treated with HVLA, ME, FPR techniques in  thoracic, lumbar, and sacral  areas  Patient tolerated the procedure well with improvement in symptoms  Patient given  exercises, stretches and lifestyle modifications  See medications in patient instructions if given  Patient will follow up in 4-8 weeks      The above documentation has been reviewed and is accurate and complete Lyndal Pulley, DO       Note: This dictation was prepared with Dragon dictation along with smaller phrase technology. Any transcriptional errors that result from this process are unintentional.

## 2020-05-10 ENCOUNTER — Other Ambulatory Visit: Payer: Self-pay

## 2020-05-10 ENCOUNTER — Encounter: Payer: Self-pay | Admitting: Family Medicine

## 2020-05-10 ENCOUNTER — Ambulatory Visit (INDEPENDENT_AMBULATORY_CARE_PROVIDER_SITE_OTHER): Payer: BC Managed Care – PPO | Admitting: Family Medicine

## 2020-05-10 VITALS — BP 122/84 | HR 90 | Ht 72.0 in | Wt 211.0 lb

## 2020-05-10 DIAGNOSIS — M533 Sacrococcygeal disorders, not elsewhere classified: Secondary | ICD-10-CM | POA: Diagnosis not present

## 2020-05-10 DIAGNOSIS — M999 Biomechanical lesion, unspecified: Secondary | ICD-10-CM

## 2020-05-10 NOTE — Assessment & Plan Note (Signed)
Patient has done remarkably well and is continuing to improve every time we have seen patient.  We discussed icing regimen and home exercise, which activities to do which wants to avoid.  Patient is to increase activity slowly.  Follow-up with me again in 12 weeks.

## 2020-05-10 NOTE — Patient Instructions (Signed)
Enjoy the beach See me in 3 months

## 2020-06-06 ENCOUNTER — Encounter: Payer: Self-pay | Admitting: Family Medicine

## 2020-07-18 ENCOUNTER — Encounter: Payer: Self-pay | Admitting: Family Medicine

## 2020-07-19 ENCOUNTER — Telehealth (INDEPENDENT_AMBULATORY_CARE_PROVIDER_SITE_OTHER): Payer: BC Managed Care – PPO | Admitting: Family Medicine

## 2020-07-19 DIAGNOSIS — R0981 Nasal congestion: Secondary | ICD-10-CM

## 2020-07-19 DIAGNOSIS — R519 Headache, unspecified: Secondary | ICD-10-CM | POA: Diagnosis not present

## 2020-07-19 DIAGNOSIS — R0982 Postnasal drip: Secondary | ICD-10-CM

## 2020-07-19 MED ORDER — AMOXICILLIN-POT CLAVULANATE 875-125 MG PO TABS: 1.0000 | ORAL_TABLET | Freq: Two times a day (BID) | ORAL | 0 refills | Status: DC

## 2020-07-19 NOTE — Progress Notes (Signed)
Virtual Visit via Video Note  I connected with Brandi Henderson  on 07/19/20 at 11:20 AM EST by a video enabled telemedicine application and verified that I am speaking with the correct person using two identifiers.  Location patient: home, [place] Location provider:work or home office Persons participating in the virtual visit: patient, provider  I discussed the limitations of evaluation and management by telemedicine and the availability of in person appointments. The patient expressed understanding and agreed to proceed.   HPI:  Acute telemedicine visit for Sinus issues: -Onset: over 1 week ago -Symptoms include: nasal congestion, now developing R maxillary sinus pain and pressure, PND, laryngitis -Denies: fevers, cough, body aches, CP, SOB, NVD -covid test was negative, her kids all had the same symptoms -Has tried: dayquil -Pertinent past medical history: hx of seasonal allergies -Pertinent medication allergies: see allergies -COVID-19 vaccine status: fully vaccinated for covid + boosted, had -denies any chance of pregnancy   ROS: See pertinent positives and negatives per HPI.  Past Medical History:  Diagnosis Date  . Allergy   . 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 Hx of varicella   . IUD (intrauterine device) in place 2014   copper   . LGA (large for gestational age) fetus affecting mother, antepartum 03/13/2016  . Meralgia paresthetica of right side 2015  . Routine gynecological examination    Dr. Orvan Seen, [facility]  . Wears glasses     Past Surgical History:  Procedure Laterality Date  . CYST REMOVAL HAND     ganglion, right  . WISDOM TOOTH EXTRACTION       Current Outpatient Medications:  .  amoxicillin-clavulanate (AUGMENTIN) 875-125 MG tablet, Take 1 tablet by mouth 2 (two) times daily., Disp: 20 tablet, Rfl: 0 .  Multiple Vitamin (MULTIVITAMIN) tablet, Take 1 tablet by mouth daily., Disp: , Rfl:   EXAM:  VITALS per patient if  applicable:  GENERAL: alert, oriented, appears well and in no acute distress  HEENT: atraumatic, conjunttiva clear, no obvious abnormalities on inspection of external nose and ears  NECK: normal movements of the head and neck  LUNGS: on inspection no signs of respiratory distress, breathing rate appears normal, no obvious gross SOB, gasping or wheezing  CV: no obvious cyanosis  MS: moves all visible extremities without noticeable abnormality  PSYCH/NEURO: pleasant and cooperative, no obvious depression or anxiety, speech and thought processing grossly intact  ASSESSMENT AND PLAN:  Discussed the following assessment and plan:  Nasal sinus congestion  PND (post-nasal drip)  Facial pain  -we discussed possible serious and likely etiologies, options for evaluation and workup, limitations of telemedicine visit vs in person visit, treatment, treatment risks and precautions. Pt prefers to treat via telemedicine empirically rather than in person at this moment. Query lingering viral upper respiratory infection versus developing bacterial sinusitis versus other. She opted to try nasal saline twice daily along with a nasal decongestant for a few days. If not improving, empiric trial of Augmentin 875 twice daily for 7 to 10 days. Work/School slipped offered:  declined Scheduled follow up with PCP offered: Agrees to follow-up if needed. Advised to seek prompt follow-up or in person care if worsening, new symptoms arise, or if is not improving with treatment.    I discussed the assessment and treatment plan with the patient. The patient was provided an opportunity to ask questions and all were answered. The patient agreed with the plan and demonstrated an understanding of the instructions.  Lucretia Kern, DO

## 2020-07-19 NOTE — Patient Instructions (Signed)
-  I sent the medication(s) we discussed to your pharmacy: Meds ordered this encounter  Medications  . amoxicillin-clavulanate (AUGMENTIN) 875-125 MG tablet    Sig: Take 1 tablet by mouth 2 (two) times daily.    Dispense:  20 tablet    Refill:  0     I hope you are feeling better soon!  Seek in person care promptly if your symptoms worsen, new concerns arise or you are not improving with treatment.  It was nice to meet you today. I help [facility] out with telemedicine visits on Tuesdays and Thursdays and am available for visits on those days. If you have any concerns or questions following this visit please schedule a follow up visit with your Primary Care doctor or seek care at a local urgent care clinic to avoid delays in care.   

## 2020-08-08 NOTE — Progress Notes (Signed)
[place] [address] [place] Phone: [phone] Subjective:   I Brandi Henderson am serving as a Education administrator for Dr. Hulan Saas.  This visit occurred during the SARS-CoV-2 public health emergency.  Safety protocols were in place, including screening questions prior to the visit, additional usage of staff PPE, and extensive cleaning of exam room while observing appropriate contact time as indicated for disinfecting solutions.   I'm seeing this patient by the request  of:  Koberlein, Steele Berg, MD  CC: low back pain follow up   [id number]  Brandi Henderson is a 39 y.o. female coming in with complaint of back and neck pain. OMT 05/10/2020. Patient states she is doing well.  Very minimal discomfort at this time.  Continue to try to stay active.  Nothing that stopping her from activity at this time and not taking any medications           Reviewed prior external information including notes and imaging from previsou exam, outside providers and external EMR if available.   As well as notes that were available from care everywhere and other healthcare systems.  Past medical history, social, surgical and family history all reviewed in electronic medical record.  No pertanent information unless stated regarding to the chief complaint.   Past Medical History:  Diagnosis Date  . Allergy   . 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 Hx of varicella   . IUD (intrauterine device) in place 2014   copper   . LGA (large for gestational age) fetus affecting mother, antepartum 03/13/2016  . Meralgia paresthetica of right side 2015  . Routine gynecological examination    Dr. Orvan Seen, [facility]  . Wears glasses     Allergies  Allergen Reactions  . Estrogens     Birth control pills, erythema nodosum     Review of Systems:  No headache, visual changes, nausea, vomiting, diarrhea, constipation,  dizziness, abdominal pain, skin rash, fevers, chills, night sweats, weight loss, swollen lymph nodes, body aches, joint swelling, chest pain, shortness of breath, mood changes. POSITIVE muscle aches  Objective  Blood pressure 120/90, pulse 86, height 6' (1.829 m), weight 223 lb (101.2 kg), SpO2 99 %.   General: No apparent distress alert and oriented x3 mood and affect normal, dressed appropriately.  HEENT: Pupils equal, extraocular movements intact  Respiratory: Patient's speak in full sentences and does not appear short of breath  Cardiovascular: No lower extremity edema, non tender, no erythema  Neuro: Cranial nerves II through XII are intact, neurovascularly intact in all extremities with 2+ DTRs and 2+ pulses.  Gait normal with good balance and coordination.  MSK:  Non tender with full range of motion and good stability and symmetric strength and tone of shoulders, elbows, wrist, hip, knee and ankles bilaterally.  Back -low back exam very mild tightness noted over the right sacroiliac joint.  Mild tightness in the thoracolumbar junction that seems to be bilateral but no spinous process tenderness.  Neurovascularly intact distally. Osteopathic findings   T9 extended rotated and side bent left L2 flexed rotated and side bent right Sacrum right on right       Assessment and Plan: SI (sacroiliac) joint dysfunction Patient is doing remarkably well.  We discussed with patient icing regimen and home exercise.  Responded extremely well to osteopathic manipulation.  Patient has been able to increase activity as tolerated.  Patient will follow up with me  again in 3 to 4 months     Nonallopathic problems  Decision today to treat with OMT was based on Physical Exam  After verbal consent patient was treated with HVLA, ME, FPR techniques in  thoracic, lumbar, and sacral  areas  Patient tolerated the procedure well with improvement in symptoms  Patient given exercises, stretches and  lifestyle modifications  See medications in patient instructions if given  Patient will follow up in 4-8 weeks      The above documentation has been reviewed and is accurate and complete Lyndal Pulley, DO       Note: This dictation was prepared with Dragon dictation along with smaller phrase technology. Any transcriptional errors that result from this process are unintentional.

## 2020-08-09 ENCOUNTER — Ambulatory Visit (INDEPENDENT_AMBULATORY_CARE_PROVIDER_SITE_OTHER): Payer: BC Managed Care – PPO | Admitting: Family Medicine

## 2020-08-09 ENCOUNTER — Other Ambulatory Visit: Payer: Self-pay

## 2020-08-09 ENCOUNTER — Encounter: Payer: Self-pay | Admitting: Family Medicine

## 2020-08-09 VITALS — BP 120/90 | HR 86 | Ht 72.0 in | Wt 223.0 lb

## 2020-08-09 DIAGNOSIS — M533 Sacrococcygeal disorders, not elsewhere classified: Secondary | ICD-10-CM | POA: Diagnosis not present

## 2020-08-09 DIAGNOSIS — M999 Biomechanical lesion, unspecified: Secondary | ICD-10-CM

## 2020-08-09 NOTE — Patient Instructions (Addendum)
Good to see you Happy Holidays! See me again in 3-4 months

## 2020-08-09 NOTE — Assessment & Plan Note (Signed)
Patient is doing remarkably well.  We discussed with patient icing regimen and home exercise.  Responded extremely well to osteopathic manipulation.  Patient has been able to increase activity as tolerated.  Patient will follow up with me again in 3 to 4 months

## 2020-09-12 ENCOUNTER — Encounter: Payer: Self-pay | Admitting: Family Medicine

## 2020-09-13 ENCOUNTER — Other Ambulatory Visit: Payer: BC Managed Care – PPO

## 2020-09-15 ENCOUNTER — Other Ambulatory Visit: Payer: Self-pay

## 2020-09-16 ENCOUNTER — Ambulatory Visit (INDEPENDENT_AMBULATORY_CARE_PROVIDER_SITE_OTHER): Payer: BC Managed Care – PPO | Admitting: Family Medicine

## 2020-09-16 ENCOUNTER — Encounter: Payer: Self-pay | Admitting: Family Medicine

## 2020-09-16 VITALS — BP 102/72 | HR 80 | Temp 97.8°F | Ht 72.0 in | Wt 222.3 lb

## 2020-09-16 DIAGNOSIS — R5383 Other fatigue: Secondary | ICD-10-CM

## 2020-09-16 DIAGNOSIS — Z131 Encounter for screening for diabetes mellitus: Secondary | ICD-10-CM

## 2020-09-16 DIAGNOSIS — R4589 Other symptoms and signs involving emotional state: Secondary | ICD-10-CM | POA: Diagnosis not present

## 2020-09-16 DIAGNOSIS — Z13220 Encounter for screening for lipoid disorders: Secondary | ICD-10-CM | POA: Diagnosis not present

## 2020-09-16 LAB — CBC WITH DIFFERENTIAL/PLATELET
Basophils Absolute: 0 10*3/uL (ref 0.0–0.1)
Basophils Relative: 0.3 % (ref 0.0–3.0)
Eosinophils Absolute: 0.2 10*3/uL (ref 0.0–0.7)
Eosinophils Relative: 3.5 % (ref 0.0–5.0)
HCT: 42.1 % (ref 36.0–46.0)
Hemoglobin: 14.6 g/dL (ref 12.0–15.0)
Lymphocytes Relative: 30.4 % (ref 12.0–46.0)
Lymphs Abs: 1.6 10*3/uL (ref 0.7–4.0)
MCHC: 34.6 g/dL (ref 30.0–36.0)
MCV: 90.5 fl (ref 78.0–100.0)
Monocytes Absolute: 0.4 10*3/uL (ref 0.1–1.0)
Monocytes Relative: 7.8 % (ref 3.0–12.0)
Neutro Abs: 3.1 10*3/uL (ref 1.4–7.7)
Neutrophils Relative %: 58 % (ref 43.0–77.0)
Platelets: 225 10*3/uL (ref 150.0–400.0)
RBC: 4.65 Mil/uL (ref 3.87–5.11)
RDW: 12.6 % (ref 11.5–15.5)
WBC: 5.3 10*3/uL (ref 4.0–10.5)

## 2020-09-16 LAB — LIPID PANEL
Cholesterol: 163 mg/dL (ref 0–200)
HDL: 48.2 mg/dL (ref >39.00)
LDL Cholesterol: 82 mg/dL (ref 0–99)
NonHDL: 114.85
Total CHOL/HDL Ratio: 3
Triglycerides: 166 mg/dL — ABNORMAL HIGH (ref 0.0–149.0)
VLDL: 33.2 mg/dL (ref 0.0–40.0)

## 2020-09-16 LAB — COMPREHENSIVE METABOLIC PANEL
ALT: 17 U/L (ref 0–35)
AST: 19 U/L (ref 0–37)
Albumin: 4.4 g/dL (ref 3.5–5.2)
Alkaline Phosphatase: 45 U/L (ref 39–117)
BUN: 11 mg/dL (ref 6–23)
CO2: 27 mEq/L (ref 19–32)
Calcium: 9.5 mg/dL (ref 8.4–10.5)
Chloride: 106 mEq/L (ref 96–112)
Creatinine, Ser: 0.8 mg/dL (ref 0.40–1.20)
GFR: 93.3 mL/min (ref >60.00)
Glucose, Bld: 93 mg/dL (ref 70–99)
Potassium: 3.9 mEq/L (ref 3.5–5.1)
Sodium: 138 mEq/L (ref 135–145)
Total Bilirubin: 0.5 mg/dL (ref 0.2–1.2)
Total Protein: 6.6 g/dL (ref 6.0–8.3)

## 2020-09-16 LAB — TSH: TSH: 1.92 u[IU]/mL (ref 0.35–4.50)

## 2020-09-16 LAB — IBC + FERRITIN
Ferritin: 44.6 ng/mL (ref 10.0–291.0)
Iron: 85 ug/dL (ref 42–145)
Saturation Ratios: 26.3 % (ref 20.0–50.0)
Transferrin: 231 mg/dL (ref 212.0–360.0)

## 2020-09-16 LAB — VITAMIN B12: Vitamin B-12: 285 pg/mL (ref 211–911)

## 2020-09-16 LAB — VITAMIN D 25 HYDROXY (VIT D DEFICIENCY, FRACTURES): VITD: 33.89 ng/mL (ref 30.00–100.00)

## 2020-09-16 LAB — HEMOGLOBIN A1C: Hgb A1c MFr Bld: 5.1 % (ref 4.6–6.5)

## 2020-09-16 MED ORDER — BUPROPION HCL ER (XL) 150 MG PO TB24: 150.0000 mg | ORAL_TABLET | Freq: Every day | ORAL | 1 refills | Status: DC

## 2020-09-16 NOTE — Progress Notes (Signed)
Brandi Henderson DOB: 11/18/1980 Encounter date: 09/16/2020  This is a 40 y.o. female who presents with Chief Complaint  Patient presents with  . Depression    Patient states she has been depressed for approximately 6 months, requests medication    History of present illness: Doesn't feel anxious.   Just feeling exhausted/overwhelmed. Good support system.   Exercising regularly, eating healthy  Does turn to food sometimes and puts on weight when not feeling well.   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   Had been medicated in 20's for ADD. Now putting off more tasks because just feels overwhelmed.   Trouble staying asleep. Still tired in the morning. Taking magnesium at night.   Working full time, in graduate school. Children are 7 and 4.   Generally periods are regular. Usually last 5-7 days. 2 heavier days; has to change about q 3 hours on those days.    Allergies  Allergen Reactions  . Estrogens     Birth control pills, erythema nodosum   Current Meds  Medication Sig  . amoxicillin-clavulanate (AUGMENTIN) 875-125 MG tablet Take 1 tablet by mouth 2 (two) times daily.  Marland Kitchen buPROPion (WELLBUTRIN XL) 150 MG 24 hr tablet Take 1 tablet (150 mg total) by mouth daily.  . Multiple Vitamin (MULTIVITAMIN) tablet Take 1 tablet by mouth daily.  Marland Kitchen TART CHERRY PO Take by mouth.  . TURMERIC PO Take by mouth.  Marland Kitchen VITAMIN D PO Take by mouth daily.    Review of Systems  Constitutional: Positive for fatigue. Negative for chills and fever.  Respiratory: Negative for cough, chest tightness, shortness of breath and wheezing.   Cardiovascular: Negative for chest pain, palpitations and leg swelling.  Psychiatric/Behavioral: Positive for decreased concentration and sleep disturbance. Negative for suicidal ideas. The patient is not nervous/anxious.     Objective:  BP 102/72 (BP Location: Left Arm, Patient Position: Sitting, Cuff Size: Large)   Pulse 80   Temp 97.8 F  (36.6 C) (Oral)   Ht 6' (1.829 m)   Wt 222 lb 4.8 oz (100.8 kg)   LMP 09/01/2020 (Exact Date)   BMI 30.15 kg/m   Weight: 222 lb 4.8 oz (100.8 kg)   BP Readings from Last 3 Encounters:  09/16/20 102/72  08/09/20 120/90  05/10/20 122/84   Wt Readings from Last 3 Encounters:  09/16/20 222 lb 4.8 oz (100.8 kg)  08/09/20 223 lb (101.2 kg)  05/10/20 211 lb (95.7 kg)    Physical Exam Constitutional:      General: She is not in acute distress.    Appearance: She is well-developed.  Neck:     Thyroid: No thyroid mass, thyromegaly or thyroid tenderness.  Cardiovascular:     Rate and Rhythm: Normal rate and regular rhythm.     Heart sounds: Normal heart sounds. No murmur heard. No friction rub.  Pulmonary:     Effort: Pulmonary effort is normal. No respiratory distress.     Breath sounds: Normal breath sounds. No wheezing or rales.  Musculoskeletal:     Right lower leg: No edema.     Left lower leg: No edema.  Neurological:     Mental Status: She is alert and oriented to person, place, and time.  Psychiatric:        Attention and Perception: Attention normal.        Mood and Affect: Mood normal.        Behavior: Behavior normal.     Comments: See phq 9 score. She  has difficulty with motivation, focus, not enjoying self. Overwhelmed with all responsibilities.    PHQ9 SCORE ONLY 09/16/2020 06/06/2017  PHQ-9 Total Score 19 0    Assessment/Plan  1. Depressed mood After discussion of treatment options, we have elected to start Wellbutrin.  She has had a therapist in the past, but does not feel like that would be helpful currently.  No anxiety present. Discussed new medication(s) today with patient. Discussed potential side effects and patient verbalized understanding. She will update me in 2 weeks with how she is doing with new medication we will do 1 month follow-up. - buPROPion (WELLBUTRIN XL) 150 MG 24 hr tablet; Take 1 tablet (150 mg total) by mouth daily.  Dispense: 90  tablet; Refill: 1  2. Screening for diabetes mellitus - Hemoglobin A1c; Future  3. Lipid screening - Comprehensive metabolic panel; Future - Lipid panel; Future  4. Fatigue, unspecified type - CBC with Differential/Platelet; Future - TSH; Future - Vitamin B12; Future - VITAMIN D 25 Hydroxy (Vit-D Deficiency, Fractures); Future - IBC + Ferritin; Future    Return in about 1 month (around 10/17/2020). 35 minutes spent in discussion with patient about treatment options, mood.     Micheline Rough, MD

## 2020-10-03 ENCOUNTER — Encounter: Payer: Self-pay | Admitting: Family Medicine

## 2020-10-11 ENCOUNTER — Other Ambulatory Visit: Payer: BC Managed Care – PPO

## 2020-10-14 ENCOUNTER — Other Ambulatory Visit: Payer: BC Managed Care – PPO

## 2020-10-24 ENCOUNTER — Ambulatory Visit: Payer: BC Managed Care – PPO | Admitting: Family Medicine

## 2020-11-04 ENCOUNTER — Other Ambulatory Visit: Payer: Self-pay

## 2020-11-07 ENCOUNTER — Ambulatory Visit (INDEPENDENT_AMBULATORY_CARE_PROVIDER_SITE_OTHER): Payer: BC Managed Care – PPO | Admitting: Family Medicine

## 2020-11-07 ENCOUNTER — Other Ambulatory Visit: Payer: Self-pay

## 2020-11-07 ENCOUNTER — Encounter: Payer: Self-pay | Admitting: Family Medicine

## 2020-11-07 VITALS — BP 100/70 | HR 92 | Temp 97.9°F | Ht 72.0 in | Wt 215.3 lb

## 2020-11-07 DIAGNOSIS — R4589 Other symptoms and signs involving emotional state: Secondary | ICD-10-CM | POA: Diagnosis not present

## 2020-11-07 NOTE — Progress Notes (Signed)
  Brandi Henderson DOB: June 30, 1981 Encounter date: 11/07/2020  This is a 40 y.o. female who presents with Chief Complaint  Patient presents with  . Follow-up    History of present illness: Last visit was in January.  At that time we discussed depression.  We started treatment with Wellbutrin.  She did send me an update that this is working well for her after her last visit.  Felt a change almost immediately with medication - even more focus portion. More sleeping; feels more like herself. Has had a couple of moments of being light headed but it quickly passes.   Got COVID about a month ago and that wiped her out for a few weeks. Then time change happened. But was feeling more energy before all that.   Allergies  Allergen Reactions  . Estrogens     Birth control pills, erythema nodosum   Current Meds  Medication Sig  . buPROPion (WELLBUTRIN XL) 150 MG 24 hr tablet Take 1 tablet (150 mg total) by mouth daily.  . Multiple Vitamin (MULTIVITAMIN) tablet Take 1 tablet by mouth daily.  Marland Kitchen TART CHERRY PO Take by mouth.  . TURMERIC PO Take by mouth.  Marland Kitchen VITAMIN D PO Take by mouth daily.    Review of Systems  Constitutional: Negative for chills, fatigue and fever.  Respiratory: Negative for cough, chest tightness, shortness of breath and wheezing.   Cardiovascular: Negative for chest pain, palpitations and leg swelling.  Psychiatric/Behavioral: The patient is not nervous/anxious.     Objective:  BP 100/70 (BP Location: Left Arm, Patient Position: Sitting, Cuff Size: Large)   Pulse 92   Temp 97.9 F (36.6 C) (Oral)   Ht 6' (1.829 m)   Wt 215 lb 4.8 oz (97.7 kg)   LMP 10/17/2020 (Exact Date)   BMI 29.20 kg/m   Weight: 215 lb 4.8 oz (97.7 kg)   BP Readings from Last 3 Encounters:  11/07/20 100/70  09/16/20 102/72  08/09/20 120/90   Wt Readings from Last 3 Encounters:  11/07/20 215 lb 4.8 oz (97.7 kg)  09/16/20 222 lb 4.8 oz (100.8 kg)  08/09/20 223 lb (101.2 kg)     Physical Exam Constitutional:      Appearance: Normal appearance.  Pulmonary:     Effort: Pulmonary effort is normal.  Neurological:     Mental Status: She is alert.  Psychiatric:        Mood and Affect: Mood normal.        Behavior: Behavior normal.        Thought Content: Thought content normal.     Assessment/Plan  1. Depressed mood She is doing really well on the wellbutrin. We will continue at current dose. She will let me know if any concerns; but otherwise will do a followup in 6 months time.   Return in about 6 months (around 05/10/2021) for Chronic condition visit.      Micheline Rough, MD

## 2020-11-30 NOTE — Progress Notes (Signed)
[place] Phone: [phone] Subjective:   [name], am serving as a scribe for Dr. Hulan Saas. This visit occurred during the SARS-CoV-2 public health emergency.  Safety protocols were in place, including screening questions prior to the visit, additional usage of staff PPE, and extensive cleaning of exam room while observing appropriate contact time as indicated for disinfecting solutions.   I'm seeing this patient by the request  of:  Koberlein, Steele Berg, MD  CC: Low back pain follow-up  [id number]  Stephaniemarie Stoffel is a 40 y.o. female coming in with complaint of back and neck pain. OMT 08/09/2020. Patient states doing very well overall.  Patient is accompanied with younger son today.  Doing relatively well.  Nothing that stopping her from activity.  Medications patient has been prescribed: None         Reviewed prior external information including notes and imaging from previsou exam, outside providers and external EMR if available.   As well as notes that were available from care everywhere and other healthcare systems.  Past medical history, social, surgical and family history all reviewed in electronic medical record.  No pertanent information unless stated regarding to the chief complaint.   Past Medical History:  Diagnosis Date  . Allergy   . 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 Hx of varicella   . IUD (intrauterine device) in place 2014   copper   . LGA (large for gestational age) fetus affecting mother, antepartum 03/13/2016  . LGA (large for gestational age) fetus affecting mother, antepartum 03/13/2016  . Meralgia paresthetica of right side 2015  . Postpartum care following vaginal delivery (7/25) 03/14/2016  . Routine gynecological examination    Dr. Orvan Seen, [facility]  . SVD (spontaneous vaginal delivery) 03/13/2016  . Wears glasses      Allergies  Allergen Reactions  . Estrogens     Birth control pills, erythema nodosum     Review of Systems:  No headache, visual changes, nausea, vomiting, diarrhea, constipation, dizziness, abdominal pain, skin rash, fevers, chills, night sweats, weight loss, swollen lymph nodes, body aches, joint swelling, chest pain, shortness of breath, mood changes. POSITIVE muscle aches  Objective  Blood pressure 114/82, pulse 78, height 6' (1.829 m), weight 220 lb (99.8 kg), SpO2 99 %.   General: No apparent distress alert and oriented x3 mood and affect normal, dressed appropriately.  HEENT: Pupils equal, extraocular movements intact  Respiratory: Patient's speak in full sentences and does not appear short of breath  Cardiovascular: No lower extremity edema, non tender, no erythema  Gait normal with good balance and coordination.  MSK:  Non tender with full range of motion and good stability and symmetric strength and tone of shoulders, elbows, wrist, hip, knee and ankles bilaterally.  Back -low back very mild tenderness to palpation of the sacroiliac joint.  Osteopathic findings  T9 extended rotated and side bent left L2 flexed rotated and side bent right Sacrum right on right       Assessment and Plan:  SI (sacroiliac) joint dysfunction Patient is doing amazingly well at this time.  Patient continues at the 3 to 30-month intervals.  No significant change in management.    Nonallopathic problems  Decision today to treat with OMT was based on Physical Exam  After verbal consent patient was treated with HVLA, ME, FPR techniques in  thoracic, lumbar, and sacral  areas  Patient tolerated the procedure well with improvement in symptoms  Patient given exercises, stretches and lifestyle modifications  See medications in patient instructions if given  Patient will follow up in 4-8 weeks      The above documentation has been reviewed and is accurate and complete Lyndal Pulley, DO       Note: This dictation was prepared with Dragon dictation along with smaller phrase technology. Any transcriptional errors that result from this process are unintentional.

## 2020-12-01 ENCOUNTER — Encounter: Payer: Self-pay | Admitting: Family Medicine

## 2020-12-01 ENCOUNTER — Ambulatory Visit (INDEPENDENT_AMBULATORY_CARE_PROVIDER_SITE_OTHER): Payer: BC Managed Care – PPO | Admitting: Family Medicine

## 2020-12-01 ENCOUNTER — Other Ambulatory Visit: Payer: Self-pay

## 2020-12-01 VITALS — BP 114/82 | HR 78 | Ht 72.0 in | Wt 220.0 lb

## 2020-12-01 DIAGNOSIS — M9904 Segmental and somatic dysfunction of sacral region: Secondary | ICD-10-CM

## 2020-12-01 DIAGNOSIS — M9902 Segmental and somatic dysfunction of thoracic region: Secondary | ICD-10-CM | POA: Diagnosis not present

## 2020-12-01 DIAGNOSIS — M533 Sacrococcygeal disorders, not elsewhere classified: Secondary | ICD-10-CM

## 2020-12-01 DIAGNOSIS — M9903 Segmental and somatic dysfunction of lumbar region: Secondary | ICD-10-CM

## 2020-12-01 NOTE — Patient Instructions (Signed)
Good to see you  rigid sole shoes for the next two weeks  Continue ibuprofen through [facility] See me again in 3-4 months

## 2020-12-01 NOTE — Assessment & Plan Note (Signed)
Patient is doing amazingly well at this time.  Patient continues at the 3 to 2-month intervals.  No significant change in management.

## 2020-12-06 ENCOUNTER — Ambulatory Visit: Payer: BC Managed Care – PPO | Admitting: Family Medicine

## 2021-01-24 NOTE — Progress Notes (Signed)
[place] Phone: [phone] Subjective:   [name], am serving as a scribe for Dr. Hulan Saas. This visit occurred during the SARS-CoV-2 public health emergency.  Safety protocols were in place, including screening questions prior to the visit, additional usage of staff PPE, and extensive cleaning of exam room while observing appropriate contact time as indicated for disinfecting solutions.   I'm seeing this patient by the request  of:  Caren Macadam, MD  CC: right foot pain   [id number]  Brandi Henderson is a 40 y.o. female coming in with complaint of right foot pain. Patient states that she has had pain for about 8 weeks. Pain in lateral tarsal bones has not improved. Pain is sharp in nature especially with weight bearing.        Past Medical History:  Diagnosis Date  . Allergy   . 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 Hx of varicella   . IUD (intrauterine device) in place 2014   copper   . LGA (large for gestational age) fetus affecting mother, antepartum 03/13/2016  . LGA (large for gestational age) fetus affecting mother, antepartum 03/13/2016  . Meralgia paresthetica of right side 2015  . Postpartum care following vaginal delivery (7/25) 03/14/2016  . Routine gynecological examination    Dr. Orvan Seen, [facility]  . SVD (spontaneous vaginal delivery) 03/13/2016  . Wears glasses    Past Surgical History:  Procedure Laterality Date  . CYST REMOVAL HAND     ganglion, right  . WISDOM TOOTH EXTRACTION     Social History   Socioeconomic History  . Marital status: Married    Spouse name: Not on file  . Number of children: Not on file  . Years of education: Not on file  . Highest education level: Not on file  Occupational History  . Not on file  Tobacco Use  . Smoking status: Never Smoker  . Smokeless tobacco: Never Used  Vaping Use   . Vaping Use: Never used  Substance and Sexual Activity  . Alcohol use: Yes    Alcohol/week: 2.0 standard drinks    Types: 1 Glasses of wine, 1 Cans of beer per week  . Drug use: No  . Sexual activity: Yes    Birth control/protection: I.U.D.  Other Topics Concern  . Not on file  Social History Narrative   Exercises 4 days per week with walking, running, biking.  Married, has 20mo son as of 8/15.   Moved from [place], [place].  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 [employer]   Social Determinants of Radio broadcast assistant Strain: Not on file  Food Insecurity: Not on file  Transportation Needs: Not on file  Physical Activity: Not on file  Stress: Not on file  Social Connections: Not on file   Allergies  Allergen Reactions  . Estrogens     Birth control pills, erythema nodosum   Family History  Problem Relation Age of Onset  . Crohn's disease Brother   . Macular degeneration Father   . Cancer Maternal Grandfather   . Diabetes Paternal Grandmother   . Heart disease Paternal Grandmother   . Macular degeneration Paternal Grandmother   . Stroke Neg Hx   . Hypertension Neg Hx   . Hyperlipidemia Neg Hx          Current Outpatient Medications (Other):  Marland Kitchen  buPROPion (WELLBUTRIN XL) 150 MG  24 hr tablet, Take 1 tablet (150 mg total) by mouth daily. Marland Kitchen  gabapentin (NEURONTIN) 100 MG capsule, Take 2 capsules (200 mg total) by mouth at bedtime. .  Multiple Vitamin (MULTIVITAMIN) tablet, Take 1 tablet by mouth daily. Marland Kitchen  TART CHERRY PO, Take by mouth. .  TURMERIC PO, Take by mouth. Marland Kitchen  VITAMIN D PO, Take by mouth daily.   Reviewed prior external information including notes and imaging from  primary care provider As well as notes that were available from care everywhere and other healthcare systems.  Past medical history, social, surgical and family history all reviewed in electronic medical record.  No pertanent information unless stated regarding to the chief complaint.    Review of Systems:  No headache, visual changes, nausea, vomiting, diarrhea, constipation, dizziness, abdominal pain, skin rash, fevers, chills, night sweats, weight loss, swollen lymph nodes, body aches, joint swelling, chest pain, shortness of breath, mood changes. POSITIVE muscle aches  Objective  Blood pressure 122/86, pulse 89, height 6' (1.829 m), weight 223 lb (101.2 kg), last menstrual period 01/11/2021, SpO2 99 %.   General: No apparent distress alert and oriented x3 mood and affect normal, dressed appropriately.  HEENT: Pupils equal, extraocular movements intact  Respiratory: Patient's speak in full sentences and does not appear short of breath  Cardiovascular: No lower extremity edema, non tender, no erythema  Gait normal with good balance and coordination.  MSK:  Foot exam shows patient does have some fullness noted over the dorsal aspect of the foot laterally.  Seems to be near the proximal fourth and fifth metatarsal.  Patient is tender to palpation in this area.  Back exam does have some mild loss of lordosis.  Some tenderness to palpation in the paraspinal musculature.  Mild positive [employer].   ltd msk [place] preformed and interpreted by [place] u/s shows patient does have some cortical irregularity of the fourth and fifth metatarsals.  Mild hypoechoic changes and increase in Doppler flow in the bony aspect.  No true fracture.. Impression: Irregularity of the fourth and fifth metatarsals proximally consistent with potential stress fracture  Impression and Recommendations:     The above documentation has been reviewed and is accurate and complete Lyndal Pulley, DO

## 2021-01-25 ENCOUNTER — Encounter: Payer: Self-pay | Admitting: Family Medicine

## 2021-01-25 ENCOUNTER — Ambulatory Visit (INDEPENDENT_AMBULATORY_CARE_PROVIDER_SITE_OTHER): Payer: BC Managed Care – PPO

## 2021-01-25 ENCOUNTER — Other Ambulatory Visit: Payer: Self-pay

## 2021-01-25 ENCOUNTER — Ambulatory Visit: Payer: Self-pay

## 2021-01-25 ENCOUNTER — Ambulatory Visit (INDEPENDENT_AMBULATORY_CARE_PROVIDER_SITE_OTHER): Payer: BC Managed Care – PPO | Admitting: Family Medicine

## 2021-01-25 VITALS — BP 122/86 | HR 89 | Ht 72.0 in | Wt 223.0 lb

## 2021-01-25 DIAGNOSIS — M9903 Segmental and somatic dysfunction of lumbar region: Secondary | ICD-10-CM | POA: Diagnosis not present

## 2021-01-25 DIAGNOSIS — M79671 Pain in right foot: Secondary | ICD-10-CM | POA: Diagnosis not present

## 2021-01-25 DIAGNOSIS — M9902 Segmental and somatic dysfunction of thoracic region: Secondary | ICD-10-CM

## 2021-01-25 DIAGNOSIS — S99921A Unspecified injury of right foot, initial encounter: Secondary | ICD-10-CM | POA: Diagnosis not present

## 2021-01-25 DIAGNOSIS — M9904 Segmental and somatic dysfunction of sacral region: Secondary | ICD-10-CM

## 2021-01-25 DIAGNOSIS — M533 Sacrococcygeal disorders, not elsewhere classified: Secondary | ICD-10-CM | POA: Diagnosis not present

## 2021-01-25 MED ORDER — GABAPENTIN 100 MG PO CAPS: 200.0000 mg | ORAL_CAPSULE | Freq: Every day | ORAL | 3 refills | Status: DC

## 2021-01-25 NOTE — Assessment & Plan Note (Signed)
Chronic problem with mild exacerbation secondary to patient's likely foot pain and how patient is walking.  Encouraged her to continue to do the home exercises and icing regimen.  Increase activity slowly.  Follow-up again in 4 to 8 weeks.

## 2021-01-25 NOTE — Patient Instructions (Signed)
Good to see you k2 100 mcg daily for 1 month 4000 vitamin D 100 mg of gabapentin nightly See me again in 4 weeks

## 2021-01-25 NOTE — Assessment & Plan Note (Signed)
Patient does have abnormality noted of the fourth and fifth metatarsals on ultrasound but x-rays do not show any cortical irregularity noted.  Patient does have some mild soft tissue swelling and effusion of the dorsal aspect of the foot.  No specific findings of once again on the x-ray.  Patient does have some signs that could be more of a neuroma and will start with 100 g of gabapentin at night.  Warned of potential side effects.  In addition to this discussed vitamin D supplementation as well as continue supplementation.  Patient was avoiding long distance walking secondary to the discomfort.  Follow-up with me again in 4 weeks.  Continue to have pain immediately consider the possibility of advanced imaging.  Nonspecific findings and pain seemed to be a little out of proportion.

## 2021-02-21 ENCOUNTER — Other Ambulatory Visit: Payer: Self-pay

## 2021-02-21 ENCOUNTER — Ambulatory Visit (INDEPENDENT_AMBULATORY_CARE_PROVIDER_SITE_OTHER): Payer: BC Managed Care – PPO | Admitting: Family Medicine

## 2021-02-21 ENCOUNTER — Encounter: Payer: Self-pay | Admitting: Family Medicine

## 2021-02-21 DIAGNOSIS — M79671 Pain in right foot: Secondary | ICD-10-CM

## 2021-02-21 DIAGNOSIS — M533 Sacrococcygeal disorders, not elsewhere classified: Secondary | ICD-10-CM | POA: Diagnosis not present

## 2021-02-21 NOTE — Assessment & Plan Note (Signed)
Patient felt stable.  We will continue to monitor.  Follow-up again in 6 weeks

## 2021-02-21 NOTE — Progress Notes (Signed)
[place] Phone: [phone] Subjective:   Brandi Henderson, am serving as a scribe for Dr. Hulan Saas.  This visit occurred during the SARS-CoV-2 public health emergency.  Safety protocols were in place, including screening questions prior to the visit, additional usage of staff PPE, and extensive cleaning of exam room while observing appropriate contact time as indicated for disinfecting solutions.    I'm seeing this patient by the request  of:  Caren Macadam, MD  CC: Foot pain and low back pain follow-up  ACZ:[id number]  Brandi Henderson is a 40 y.o. female coming in with complaint of back and neck pain. Omt 01/25/2021. Patient states that she is doing much better. Has been wearing the boot and this has helped. Henderson pain at all any more.   Back pain is also been doing ok.   Medications patient has been prescribed: Gabapentin  Taking: Intermittently   Patient had follow-up also had ultrasound showing the patient may have a cortical irregularity of the fourth and fifth metatarsals that were consistent with a stress reaction.     Reviewed prior external information including notes and imaging from previsou exam, outside providers and external EMR if available.   As well as notes that were available from care everywhere and other healthcare systems.  Past medical history, social, surgical and family history all reviewed in electronic medical record.  Henderson pertanent information unless stated regarding to the chief complaint.   Past Medical History:  Diagnosis Date   Allergy    Erythema nodosum 2001   with OCPs and Mononucleosis prior   Hx of varicella    IUD (intrauterine device) in place 2014   copper    LGA (large for gestational age) fetus affecting mother, antepartum 03/13/2016   LGA (large for gestational age) fetus affecting mother, antepartum 03/13/2016   Meralgia paresthetica of  right side 2015   Postpartum care following vaginal delivery (7/25) 03/14/2016   Routine gynecological examination    Dr. Orvan Seen, [facility]   SVD (spontaneous vaginal delivery) 03/13/2016   Wears glasses     Allergies  Allergen Reactions   Estrogens     Birth control pills, erythema nodosum     Review of Systems:  Henderson headache, visual changes, nausea, vomiting, diarrhea, constipation, dizziness, abdominal pain, skin rash, fevers, chills, night sweats, weight loss, swollen lymph nodes, body aches, joint swelling, chest pain, shortness of breath, mood changes.   Objective  Blood pressure 110/82, pulse 68, height 6' (1.829 m), weight 228 lb (103.4 kg), SpO2 99 %.   General: Henderson apparent distress alert and oriented x3 mood and affect normal, dressed appropriately.  HEENT: Pupils equal, extraocular movements intact  Respiratory: Patient's speak in full sentences and does not appear short of breath  Cardiovascular: Henderson lower extremity edema, non tender, Henderson erythema  Foot exam shows patient does have breakdown of the transverse arch noted.  Patient really does not have much tenderness at all.  Negative squeeze test at the moment.  Good range of motion of the ankle.        Assessment and Plan:  Right foot pain Patient doing relatively well overall.  Discussed icing regimen and home exercises.  Discussed over-the-counter orthotics with patient transitioning out of the boot.  Increase range of motion of the ankle.  Does have developed pain in his knees that I think patient will do well.  Follow-up with me again in 6  weeks  SI (sacroiliac) joint dysfunction Patient felt stable.  We will continue to monitor.  Follow-up again in 6 weeks          The above documentation has been reviewed and is accurate and complete Lyndal Pulley, DO        Note: This dictation was prepared with Dragon dictation along with smaller phrase technology. Any transcriptional errors that result  from this process are unintentional.

## 2021-02-21 NOTE — Assessment & Plan Note (Signed)
Patient doing relatively well overall.  Discussed icing regimen and home exercises.  Discussed over-the-counter orthotics with patient transitioning out of the boot.  Increase range of motion of the ankle.  Does have developed pain in his knees that I think patient will do well.  Follow-up with me again in 6 weeks

## 2021-02-21 NOTE — Patient Instructions (Signed)
Foots looking good Spenco Total Support Orthotics Rigid soled shoe See me as scheduled for anything else

## 2021-03-18 ENCOUNTER — Other Ambulatory Visit: Payer: Self-pay | Admitting: Family Medicine

## 2021-03-18 DIAGNOSIS — R4589 Other symptoms and signs involving emotional state: Secondary | ICD-10-CM

## 2021-04-04 ENCOUNTER — Ambulatory Visit: Payer: BC Managed Care – PPO | Admitting: Family Medicine

## 2021-04-13 ENCOUNTER — Encounter: Payer: Self-pay | Admitting: Family Medicine

## 2021-04-13 ENCOUNTER — Ambulatory Visit (INDEPENDENT_AMBULATORY_CARE_PROVIDER_SITE_OTHER): Payer: BC Managed Care – PPO | Admitting: Family Medicine

## 2021-04-13 ENCOUNTER — Other Ambulatory Visit: Payer: Self-pay

## 2021-04-13 VITALS — BP 122/84 | HR 71 | Ht 72.0 in | Wt 220.0 lb

## 2021-04-13 DIAGNOSIS — M533 Sacrococcygeal disorders, not elsewhere classified: Secondary | ICD-10-CM | POA: Diagnosis not present

## 2021-04-13 DIAGNOSIS — M9904 Segmental and somatic dysfunction of sacral region: Secondary | ICD-10-CM

## 2021-04-13 DIAGNOSIS — M9902 Segmental and somatic dysfunction of thoracic region: Secondary | ICD-10-CM | POA: Diagnosis not present

## 2021-04-13 DIAGNOSIS — M9903 Segmental and somatic dysfunction of lumbar region: Secondary | ICD-10-CM | POA: Diagnosis not present

## 2021-04-13 NOTE — Progress Notes (Signed)
[facility] [place] Phone: [phone] Subjective:   Brandi Henderson, am serving as a scribe for Dr. Hulan Saas.  I'm seeing this patient by the request  of:  Caren Macadam, MD  CC: Low back pain follow-up  [id number]  02/21/2021 Patient doing relatively well overall.  Discussed icing regimen and home exercises.  Discussed over-the-counter orthotics with patient transitioning out of the boot.  Increase range of motion of the ankle.  Does have developed pain in his knees that I think patient will do well.  Follow-up with me again in 6 weeks  Update 04/13/2021 Brandi Henderson is a 40 y.o. female coming in with complaint of back and neck pain. OMT 02/21/2021. Right foot pain f/u. Patient states that she is doing well just ready for OMT.  Patient states just mild tightness.  Nothing that is stopping her from activity.  He states that the foot is completely healed at this time.  Medications patient has been prescribed: Gabapentin  Taking: gabapentin          Reviewed prior external information including notes and imaging from previsou exam, outside providers and external EMR if available.   As well as notes that were available from care everywhere and other healthcare systems.  Past medical history, social, surgical and family history all reviewed in electronic medical record.  No pertanent information unless stated regarding to the chief complaint.   Past Medical History:  Diagnosis Date   Allergy    Erythema nodosum 2001   with OCPs and Mononucleosis prior   Hx of varicella    IUD (intrauterine device) in place 2014   copper    LGA (large for gestational age) fetus affecting mother, antepartum 03/13/2016   LGA (large for gestational age) fetus affecting mother, antepartum 03/13/2016   Meralgia paresthetica of right side 2015   Postpartum care following vaginal delivery (7/25) 03/14/2016    Routine gynecological examination    Dr. Orvan Seen, [facility]   SVD (spontaneous vaginal delivery) 03/13/2016   Wears glasses     Allergies  Allergen Reactions   Estrogens     Birth control pills, erythema nodosum     Review of Systems:  No headache, visual changes, nausea, vomiting, diarrhea, constipation, dizziness, abdominal pain, skin rash, fevers, chills, night sweats, weight loss, swollen lymph nodes, body aches, joint swelling, chest pain, shortness of breath, mood changes. POSITIVE muscle aches  Objective  Blood pressure 122/84, pulse 71, height 6' (1.829 m), weight 220 lb (99.8 kg), SpO2 99 %.   General: No apparent distress alert and oriented x3 mood and affect normal, dressed appropriately.  HEENT: Pupils equal, extraocular movements intact  Respiratory: Patient's speak in full sentences and does not appear short of breath  Cardiovascular: No lower extremity edema, non tender, no erythema  Patient's low back does have tenderness over the right sacroiliac joint.  Nontender to palpation as well.  Mild tightness with FABER test.  Negative straight leg test.  Osteopathic findings   T9 extended rotated and side bent left L2 flexed rotated and side bent right Sacrum right on right       Assessment and Plan:  SI (sacroiliac) joint dysfunction Patient will back to have significant tightness on the right side.  Patient does respond well to osteopathic manipulation.  Discussed posture and ergonomics.  Discussed home exercises.  Follow-up with me again in 6 weeks   Nonallopathic problems  Decision today to  treat with OMT was based on Physical Exam  After verbal consent patient was treated with HVLA, ME, FPR techniques in  thoracic, lumbar, and sacral  areas  Patient tolerated the procedure well with improvement in symptoms  Patient given exercises, stretches and lifestyle modifications  See medications in patient instructions if given  Patient will follow up  in 8 weeks      The above documentation has been reviewed and is accurate and complete Lyndal Pulley, DO        Note: This dictation was prepared with Dragon dictation along with smaller phrase technology. Any transcriptional errors that result from this process are unintentional.

## 2021-04-13 NOTE — Patient Instructions (Signed)
You're cool  Wear better shoes  See me again in 2-3 months

## 2021-04-13 NOTE — Assessment & Plan Note (Signed)
Patient will back to have significant tightness on the right side.  Patient does respond well to osteopathic manipulation.  Discussed posture and ergonomics.  Discussed home exercises.  Follow-up with me again in 6 weeks

## 2021-05-10 ENCOUNTER — Other Ambulatory Visit: Payer: Self-pay

## 2021-05-10 ENCOUNTER — Encounter: Payer: Self-pay | Admitting: Family Medicine

## 2021-05-10 ENCOUNTER — Ambulatory Visit (INDEPENDENT_AMBULATORY_CARE_PROVIDER_SITE_OTHER): Payer: BC Managed Care – PPO | Admitting: Family Medicine

## 2021-05-10 VITALS — BP 102/80 | HR 84 | Temp 98.2°F | Ht 72.0 in | Wt 221.5 lb

## 2021-05-10 DIAGNOSIS — F988 Other specified behavioral and emotional disorders with onset usually occurring in childhood and adolescence: Secondary | ICD-10-CM

## 2021-05-10 DIAGNOSIS — R4589 Other symptoms and signs involving emotional state: Secondary | ICD-10-CM

## 2021-05-10 MED ORDER — BUPROPION HCL ER (XL) 300 MG PO TB24: 300.0000 mg | ORAL_TABLET | Freq: Every day | ORAL | 1 refills | Status: DC

## 2021-05-10 NOTE — Progress Notes (Signed)
Brandi Henderson DOB: November 02, 1980 Encounter date: 05/10/2021  This is a 40 y.o. female who presents with Chief Complaint  Patient presents with   Follow-up    History of present illness: Last visit was March/2022  Depression: Wellbutrin 150mg  daily. Job has changed and more responsibility; son also just dx with autism. Feeling more flat. Feeling less patient, more easily frustrated. Feels a little overwhelmed.   She is working out a lot still. She is trying to protect some of her time - like giving self some days just for her if needed.   This week has had trouble falling asleep. Sometimes wakes in middle of night. Sometimes thinking through everything; turning things into a worry. Doesn't feel anxious.  She was diagnosed with adhd in elementary school. She started taking meds as senior in college and continued until she started family. Didn't go back on between pregnancies/nursing. 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  Took vyvanse in the past. Did well with this. Thinks she did ritalin initially, did extended release/short acting ritalin. Didn't do adderall.    Allergies  Allergen Reactions   Estrogens     Birth control pills, erythema nodosum   Current Meds  Medication Sig   Multiple Vitamin (MULTIVITAMIN) tablet Take 1 tablet by mouth daily.   TART CHERRY PO Take by mouth.   TURMERIC PO Take by mouth.   VITAMIN D PO Take by mouth daily.   [DISCONTINUED] buPROPion (WELLBUTRIN XL) 150 MG 24 hr tablet TAKE ONE TABLET BY MOUTH DAILY    Review of Systems  Constitutional:  Negative for chills, fatigue and fever.  Respiratory:  Negative for cough, chest tightness, shortness of breath and wheezing.   Cardiovascular:  Negative for chest pain, palpitations and leg swelling.  Psychiatric/Behavioral:  Positive for agitation and sleep disturbance.    Objective:  BP 102/80 (BP Location: Left Arm, Patient  Position: Sitting, Cuff Size: Large)   Pulse 84   Temp 98.2 F (36.8 C) (Oral)   Ht 6' (1.829 m)   Wt 221 lb 8 oz (100.5 kg)   LMP 04/18/2021 (Exact Date)   SpO2 99%   BMI 30.04 kg/m   Weight: 221 lb 8 oz (100.5 kg)   BP Readings from Last 3 Encounters:  05/10/21 102/80  04/13/21 122/84  02/21/21 110/82   Wt Readings from Last 3 Encounters:  05/10/21 221 lb 8 oz (100.5 kg)  04/13/21 220 lb (99.8 kg)  02/21/21 228 lb (103.4 kg)    Physical Exam Constitutional:      General: She is not in acute distress.    Appearance: She is well-developed.  Cardiovascular:     Rate and Rhythm: Normal rate and regular rhythm.     Heart sounds: Normal heart sounds. No murmur heard.   No friction rub.  Pulmonary:     Effort: Pulmonary effort is normal. No respiratory distress.     Breath sounds: Normal breath sounds. No wheezing or rales.  Musculoskeletal:     Right lower leg: No edema.     Left lower leg: No edema.  Neurological:     Mental Status: She is alert and oriented to person, place, and time.  Psychiatric:        Behavior: Behavior normal.    Assessment/Plan   1. Depressed mood We are going to increase the wellbutrin to 300mg  daily. She will let me know how this does in 3-4 weeks. Consider adding in treatment  for ADD. Was on vyvanse in past, ritalin. Looks like the coverage is good for adderall which she hasn't tried before. Will pay attention to sleep as well.   2. Attention deficit disorder (ADD) without hyperactivity See above.   31 minutes spent with discussion treatment options.  Return in about 3 months (around 08/09/2021) for Chronic condition visit.    Micheline Rough, MD

## 2021-05-17 DIAGNOSIS — Z23 Encounter for immunization: Secondary | ICD-10-CM | POA: Diagnosis not present

## 2021-06-16 ENCOUNTER — Encounter: Payer: Self-pay | Admitting: Family Medicine

## 2021-07-18 ENCOUNTER — Ambulatory Visit: Payer: BC Managed Care – PPO | Admitting: Family Medicine

## 2021-08-09 ENCOUNTER — Ambulatory Visit (INDEPENDENT_AMBULATORY_CARE_PROVIDER_SITE_OTHER): Payer: BC Managed Care – PPO | Admitting: Family Medicine

## 2021-08-09 ENCOUNTER — Encounter: Payer: Self-pay | Admitting: Family Medicine

## 2021-08-09 VITALS — BP 112/78 | HR 64 | Temp 98.0°F | Ht 72.0 in | Wt 223.9 lb

## 2021-08-09 DIAGNOSIS — R4589 Other symptoms and signs involving emotional state: Secondary | ICD-10-CM | POA: Diagnosis not present

## 2021-08-09 DIAGNOSIS — F419 Anxiety disorder, unspecified: Secondary | ICD-10-CM

## 2021-08-09 DIAGNOSIS — F988 Other specified behavioral and emotional disorders with onset usually occurring in childhood and adolescence: Secondary | ICD-10-CM | POA: Diagnosis not present

## 2021-08-09 MED ORDER — BUSPIRONE HCL 15 MG PO TABS: ORAL_TABLET | ORAL | 2 refills | Status: DC

## 2021-08-09 NOTE — Progress Notes (Signed)
Brandi Henderson DOB: March 14, 1981 Encounter date: 08/09/2021  This is a 40 y.o. female who presents with Chief Complaint  Patient presents with   Follow-up    History of present illness: Last visit with me was 05/10/2021.  Depression: At last visit, Wellbutrin was increased to 300 mg daily. Has had some rough weeks since last visit; so hard to gauge overall. Feels like increased dose of wellbutrin feels right. After last visit was thinking through anxietyquestionsmore.  ADD: feels that attention is better with the Wellbutrin on board.  Has laryngitis, but feels fine overall.  Symptoms to start in the last 24 hours.  She gets laryngitis frequently.  No other sinus congestion, sinus pain or pressure, or cough.   Allergies  Allergen Reactions   Estrogens     Birth control pills, erythema nodosum   Current Meds  Medication Sig   buPROPion (WELLBUTRIN XL) 300 MG 24 hr tablet Take 1 tablet (300 mg total) by mouth daily.   busPIRone (BUSPAR) 15 MG tablet Start with 1/2 tablet twice daily x 7 days, then increase to full tablet (15mg ) BID.   Multiple Vitamin (MULTIVITAMIN) tablet Take 1 tablet by mouth daily.   TART CHERRY PO Take by mouth.   TURMERIC PO Take by mouth.   VITAMIN D PO Take by mouth daily.    Review of Systems  Constitutional:  Negative for chills, fatigue and fever.  Respiratory:  Negative for cough, chest tightness, shortness of breath and wheezing.   Cardiovascular:  Negative for chest pain, palpitations and leg swelling.  Psychiatric/Behavioral:  Positive for decreased concentration. The patient is nervous/anxious.    Objective:  BP 112/78 (BP Location: Left Arm, Patient Position: Sitting, Cuff Size: Large)    Pulse 64    Temp 98 F (36.7 C) (Oral)    Ht 6' (1.829 m)    Wt 223 lb 14.4 oz (101.6 kg)    LMP 08/02/2021    SpO2 99%    BMI 30.37 kg/m   Weight: 223 lb 14.4 oz (101.6 kg)   BP Readings from Last 3 Encounters:  08/09/21 112/78  05/10/21  102/80  04/13/21 122/84   Wt Readings from Last 3 Encounters:  08/09/21 223 lb 14.4 oz (101.6 kg)  05/10/21 221 lb 8 oz (100.5 kg)  04/13/21 220 lb (99.8 kg)    Physical Exam Constitutional:      General: She is not in acute distress.    Appearance: She is well-developed.  Cardiovascular:     Rate and Rhythm: Normal rate and regular rhythm.     Heart sounds: Normal heart sounds. No murmur heard.   No friction rub.  Pulmonary:     Effort: Pulmonary effort is normal. No respiratory distress.     Breath sounds: Normal breath sounds. No wheezing or rales.  Musculoskeletal:     Right lower leg: No edema.     Left lower leg: No edema.  Neurological:     Mental Status: She is alert and oriented to person, place, and time.  Psychiatric:        Behavior: Behavior normal.   GAD 7 : Generalized Anxiety Score 08/09/2021  Nervous, Anxious, on Edge 1  Control/stop worrying 1  Worry too much - different things 1  Trouble relaxing 1  Restless 0  Easily annoyed or irritable 3  Afraid - awful might happen 1  Total GAD 7 Score 8  Anxiety Difficulty Not difficult at all    Depression screen [facility] 2/9 08/09/2021  09/16/2020 06/06/2017  Decreased Interest 0 2 0  Down, Depressed, Hopeless 0 3 0  PHQ - 2 Score 0 5 0  Altered sleeping 1 2 -  Tired, decreased energy 1 3 -  Change in appetite 2 3 -  Feeling bad or failure about yourself  0 2 -  Trouble concentrating 0 3 -  Moving slowly or fidgety/restless 0 1 -  Suicidal thoughts 0 0 -  PHQ-9 Score 4 19 -    Assessment/Plan  1. Depressed mood She has definitely had some improvement with Wellbutrin.  She still has some periods of difficulties with mood.  Mild hormonal fluctuation with mood.  Also addition of some anxiety symptoms.  We will continue with current dose of Wellbutrin.  We discussed adding SSRI versus BuSpar to help with anxiety.  She is planning on seeing a counselor, but would like to work on improving mood prior to  this.  2. Attention deficit disorder (ADD) without hyperactivity Wellbutrin is helped with focus.  Also work is complementary to short attention span.  3. Anxiety We are going to add BuSpar.Discussed new medication(s) today with patient. Discussed potential side effects and patient verbalized understanding.  She will update me in a month and let me know how she does with this new medication.  Return for pending update through mychart in 1 month.     Micheline Rough, MD

## 2021-08-23 ENCOUNTER — Encounter: Payer: Self-pay | Admitting: Family Medicine

## 2021-09-05 ENCOUNTER — Ambulatory Visit (INDEPENDENT_AMBULATORY_CARE_PROVIDER_SITE_OTHER): Payer: 59 | Admitting: Psychology

## 2021-09-05 DIAGNOSIS — F4323 Adjustment disorder with mixed anxiety and depressed mood: Secondary | ICD-10-CM
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
## 2021-09-06 NOTE — Progress Notes (Deleted)
[place] Phone: [phone] Subjective:    I'm seeing this patient by the request  of:  Caren Macadam, MD  CC:   [id number]  Brandi Henderson is a 41 y.o. female coming in with complaint of back and neck pain> OMT 04/13/2021. Patient states   Medications patient has been prescribed: None  Taking:         Reviewed prior external information including notes and imaging from previsou exam, outside providers and external EMR if available.   As well as notes that were available from care everywhere and other healthcare systems.  Past medical history, social, surgical and family history all reviewed in electronic medical record.  No pertanent information unless stated regarding to the chief complaint.   Past Medical History:  Diagnosis Date   Allergy    Erythema nodosum 2001   with OCPs and Mononucleosis prior   Hx of varicella    IUD (intrauterine device) in place 2014   copper    LGA (large for gestational age) fetus affecting mother, antepartum 03/13/2016   LGA (large for gestational age) fetus affecting mother, antepartum 03/13/2016   Meralgia paresthetica of right side 2015   Postpartum care following vaginal delivery (7/25) 03/14/2016   Routine gynecological examination    Dr. Orvan Seen, [facility]   SVD (spontaneous vaginal delivery) 03/13/2016   Wears glasses     Allergies  Allergen Reactions   Estrogens     Birth control pills, erythema nodosum     Review of Systems:  No headache, visual changes, nausea, vomiting, diarrhea, constipation, dizziness, abdominal pain, skin rash, fevers, chills, night sweats, weight loss, swollen lymph nodes, body aches, joint swelling, chest pain, shortness of breath, mood changes. POSITIVE muscle aches  Objective  There were no vitals taken for this visit.   General: No apparent distress alert and oriented x3 mood and affect  normal, dressed appropriately.  HEENT: Pupils equal, extraocular movements intact  Respiratory: Patient's speak in full sentences and does not appear short of breath  Cardiovascular: No lower extremity edema, non tender, no erythema  Neuro: Cranial nerves II through XII are intact, neurovascularly intact in all extremities with 2+ DTRs and 2+ pulses.  Gait normal with good balance and coordination.  MSK:  Non tender with full range of motion and good stability and symmetric strength and tone of shoulders, elbows, wrist, hip, knee and ankles bilaterally.  Back - Normal skin, Spine with normal alignment and no deformity.  No tenderness to vertebral process palpation.  Paraspinous muscles are not tender and without spasm.   Range of motion is full at neck and lumbar sacral regions  Osteopathic findings  C2 flexed rotated and side bent right C6 flexed rotated and side bent left T3 extended rotated and side bent right inhaled rib T9 extended rotated and side bent left L2 flexed rotated and side bent right Sacrum right on right       Assessment and Plan:    Nonallopathic problems  Decision today to treat with OMT was based on Physical Exam  After verbal consent patient was treated with HVLA, ME, FPR techniques in cervical, rib, thoracic, lumbar, and sacral  areas  Patient tolerated the procedure well with improvement in symptoms  Patient given exercises, stretches and lifestyle modifications  See medications in patient instructions if given  Patient will follow up in 4-8 weeks      The above documentation has been  reviewed and is accurate and complete Jacqualin Combes       Note: This dictation was prepared with Dragon dictation along with smaller phrase technology. Any transcriptional errors that result from this process are unintentional.

## 2021-09-12 ENCOUNTER — Ambulatory Visit: Payer: BC Managed Care – PPO | Admitting: Family Medicine

## 2021-09-21 ENCOUNTER — Ambulatory Visit (INDEPENDENT_AMBULATORY_CARE_PROVIDER_SITE_OTHER): Payer: 59 | Admitting: Psychology

## 2021-09-21 DIAGNOSIS — F4323 Adjustment disorder with mixed anxiety and depressed mood: Secondary | ICD-10-CM

## 2021-09-21 NOTE — Progress Notes (Signed)
[facility] Counselor/Therapist Progress Note  Patient ID: Brandi Henderson, MRN: [id number],    Date: 09/21/2021  Time Spent: 50 mins  Treatment Type: Individual Therapy  Reported Symptoms: Pt presents for follow up session in person and grants consent for the session.  Mental Status Exam: Appearance:  Casual     Behavior: Appropriate  Motor: Normal  Speech/Language:  Clear and Coherent  Affect: Appropriate  Mood: normal  Thought process: normal  Thought content:   WNL  Sensory/Perceptual disturbances:   WNL  Orientation: oriented to person, place, and time/date  Attention: Good  Concentration: Good  Memory: WNL  Fund of knowledge:  Good  Insight:   Good  Judgment:  Good  Impulse Control: Good   Risk Assessment: Danger to Self:  No Self-injurious Behavior: No Danger to Others: No Duty to Warn:no Physical Aggression / Violence:No  Access to Firearms a concern: No  Gang Involvement:No   Subjective: Pt shares she has been "OK" since our last session.  "My job responsibilities have increase in the past 6 months and that is overwhelming."  Pt is doing the duties of two jobs and will likely continue this way until the beginning of the next school year.  Pt has someone on staff that is new and should be assuming more duties but pt does not have sufficient time to train this new person.  Pt shares that the head of school struggles with have a poor work/life balance and pt is trying to have a better balance for herself.  Talked with pt about the benefits of self care activities and pt uses exercise, reading (both am and pm), creating space in her schedule for herself and baking.  Encouraged pt to continue with her self care activities and we will talk more at our follow up session in 2 wks about possible ways to support pt in her work life and ways to possibly talk with the head of school about providing more support for pt.   Interventions: Cognitive  Behavioral Therapy  Diagnosis:Adjustment disorder with mixed anxiety and depressed mood  Plan: Treatment Plan Strengths/Abilities:  Intelligent, Intuitive, Willing to participate in therapy Treatment Preferences:  Outpatient Individual Therapy Statement of Needs:  Patient is to use CBT, mindfulness and coping skills to help manage and/or decrease symptoms associated with their diagnosis. Symptoms:  Depressed/Irritable mood, worry, social withdrawal Problems Addressed:  Depressive thoughts, Sadness, Sleep issues, etc. Long Term Goals:  Pt to reduce overall level, frequency, and intensity of the feelings of depression/anxiety as evidenced by decreased irritability, negative self talk, and helpless feelings from 6 to 7 days/week to 0 to 1 days/week, per client report, for at least 3 consecutive months.  Progress: 10% Short Term Goals:  Pt to verbally express understanding of the relationship between feelings of depression/anxiety and their impact on thinking patterns and behaviors.  Pt to verbalize an understanding of the role that distorted thinking plays in creating fears, excessive worry, and ruminations.  Progress: 10% Target Date:  09/21/2022 Frequency:  Bi-weekly Modality:  Cognitive Behavioral Therapy Interventions by Therapist:  Therapist will use CBT, Mindfulness exercises, Coping skills and Referrals, as needed by client. Client has verbally approved this treatment plan.  Ivan Anchors, [facility]

## 2021-10-05 ENCOUNTER — Ambulatory Visit (INDEPENDENT_AMBULATORY_CARE_PROVIDER_SITE_OTHER): Payer: 59 | Admitting: Psychology

## 2021-10-05 DIAGNOSIS — F4323 Adjustment disorder with mixed anxiety and depressed mood: Secondary | ICD-10-CM

## 2021-10-05 NOTE — Progress Notes (Signed)
[facility] Counselor/Therapist Progress Note  Patient ID: Brandi Henderson, MRN: [id number],    Date: 10/05/2021  Time Spent: 50 mins  Treatment Type: Individual Therapy  Reported Symptoms: Pt presents for follow up session in person and grants consent for the session.  Mental Status Exam: Appearance:  Casual     Behavior: Appropriate  Motor: Normal  Speech/Language:  Clear and Coherent  Affect: Appropriate  Mood: normal  Thought process: normal  Thought content:   WNL  Sensory/Perceptual disturbances:   WNL  Orientation: oriented to person, place, and time/date  Attention: Good  Concentration: Good  Memory: WNL  Fund of knowledge:  Good  Insight:   Good  Judgment:  Good  Impulse Control: Good   Risk Assessment: Danger to Self:  No Self-injurious Behavior: No Danger to Others: No Duty to Warn:no Physical Aggression / Violence:No  Access to Firearms a concern: No  Gang Involvement:No   Subjective: Pt shares she has spoken to the head of school about some of her concerns; pt "felt good about how the conversation went.  I feel like I am starting to get some forward momentum.  One of the people that works under me is leaving and that is a good thing.  The other person is transitioning to another role in the school.  I will going to be able to hire the team I need now."  Pt is planning to orient to the head of school role, beginning in the coming summer.  "Sometimes I feel like I am playing 'Whack [employer] Mole' in my life.  It seems like school is going pretty well right now and today I have been more concerned about Issac (anxiety, ASD, etc.) and what is going on with him and what he needs."  Pt shares that in January they began to notice some more anxiety related tics from him.  He had a meltdown yesterday and pt was not at school due to a mtg away from school.  Pt gets tearful when describing what has happened in the past couple of weeks.  Pt shares that  coming to therapy is one of the things she is doing for support with this issue; [name] is also supportive of pt in this as well.  Pt shares that [name] was born with only one kidney and has an annual follow up with the nephrologist.  He also has asthma.  Pt realizes that a lot of her anxiety is around the issue of the kids.  Pt shares she has been choosing sleep over working out this week; pt also shares, "I haven't really been doing enough to take care of myself recently."  Encouraged pt to begin working on a Gratitude List to begin to increase her focus on what she has rather than what she has to do or what she doesn't have.  Also encouraged pt to be intentional about her self care activities between now and our follow up session in 2 wks.  Interventions: Cognitive Behavioral Therapy  Diagnosis:Adjustment disorder with mixed anxiety and depressed mood  Plan: Treatment Plan Strengths/Abilities:  Intelligent, Intuitive, Willing to participate in therapy Treatment Preferences:  Outpatient Individual Therapy Statement of Needs:  Patient is to use CBT, mindfulness and coping skills to help manage and/or decrease symptoms associated with their diagnosis. Symptoms:  Depressed/Irritable mood, worry, social withdrawal Problems Addressed:  Depressive thoughts, Sadness, Sleep issues, etc. Long Term Goals:  Pt to reduce overall level, frequency, and intensity of the feelings of depression/anxiety as  evidenced by decreased irritability, negative self talk, and helpless feelings from 6 to 7 days/week to 0 to 1 days/week, per client report, for at least 3 consecutive months.  Progress: 10% Short Term Goals:  Pt to verbally express understanding of the relationship between feelings of depression/anxiety and their impact on thinking patterns and behaviors.  Pt to verbalize an understanding of the role that distorted thinking plays in creating fears, excessive worry, and ruminations.  Progress: 10% Target Date:   09/21/2022 Frequency:  Bi-weekly Modality:  Cognitive Behavioral Therapy Interventions by Therapist:  Therapist will use CBT, Mindfulness exercises, Coping skills and Referrals, as needed by client. Client has verbally approved this treatment plan.  Ivan Anchors, [facility]

## 2021-10-19 ENCOUNTER — Ambulatory Visit (INDEPENDENT_AMBULATORY_CARE_PROVIDER_SITE_OTHER): Payer: 59 | Admitting: Psychology

## 2021-10-19 DIAGNOSIS — F4323 Adjustment disorder with mixed anxiety and depressed mood: Secondary | ICD-10-CM | POA: Diagnosis not present

## 2021-10-19 NOTE — Progress Notes (Signed)
[facility] Counselor/Therapist Progress Note ? ?Patient ID: Brandi Henderson, MRN: [id number],   ? ?Date: 10/19/2021 ? ?Time Spent: 50 mins ? ?Treatment Type: Individual Therapy ? ?Reported Symptoms: Pt presents for follow up session in person and grants consent for the session. ? ?Mental Status Exam: ?Appearance:  Casual     ?Behavior: Appropriate  ?Motor: Normal  ?Speech/Language:  Clear and Coherent  ?Affect: Appropriate  ?Mood: normal  ?Thought process: normal  ?Thought content:   WNL  ?Sensory/Perceptual disturbances:   WNL  ?Orientation: oriented to person, place, and time/date  ?Attention: Good  ?Concentration: Good  ?Memory: WNL  ?Fund of knowledge:  Good  ?Insight:   Good  ?Judgment:  Good  ?Impulse Control: Good  ? ?Risk Assessment: ?Danger to Self:  No ?Self-injurious Behavior: No ?Danger to Others: No ?Duty to Warn:no ?Physical Aggression / Violence:No  ?Access to Firearms a concern: No  ?Gang Involvement:No  ? ?Subjective: Pt shares she knows that she was struggling in our last session and things are about the same.  "A lot of people at work are struggling and that is hard.  I feel like I am dealing with crises all the time.  It has been a hard last 3 years.  Sometimes it just feels impossible.  Bland Span continues to struggle and that is hard to fix.  We have scheduled an appt for him with a psychiatrist; the appt is in March.  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  Pt is also making progress with considering not taking more things on; "I was with my friends on Saturday night and they all said, 'You are doing to much and you need to stop'."  Pt shares she has already said no to some things but experiences a sense of guilt with that.  Pt is clearly feeling overwhelmed with work at this time.  There is also an upcoming legal issue  with the school and a former teacher and that is hard on pt as well.  Encouraged pt to begin working on a Gratitude List to begin to increase her focus on what she has rather than what she has to do or what she doesn't have.  Also encouraged pt to be intentional about her self care activities between now and our follow up session in 2 wks. ? ?Interventions: Cognitive Behavioral Therapy ? ?Diagnosis:Adjustment disorder with mixed anxiety and depressed mood ? ?Plan: Treatment Plan ?Strengths/Abilities:  Intelligent, Intuitive, Willing to participate in therapy ?Treatment Preferences:  Outpatient Individual Therapy ?Statement of Needs:  Patient is to use CBT, mindfulness and coping skills to help manage and/or decrease symptoms associated with their diagnosis. ?Symptoms:  Depressed/Irritable mood, worry, social withdrawal ?Problems Addressed:  Depressive thoughts, Sadness, Sleep issues, etc. ?Long Term Goals:  Pt to reduce overall level, frequency, and intensity of the feelings of depression/anxiety as evidenced by decreased irritability, negative self talk, and helpless feelings from 6 to 7 days/week to 0 to 1 days/week, per client report, for at least 3 consecutive months.  Progress: 10% ?Short Term Goals:  Pt to verbally express understanding of the relationship between feelings of depression/anxiety and their impact on thinking patterns and behaviors.  Pt to verbalize an understanding of the role that distorted thinking plays in creating fears, excessive worry, and ruminations.  Progress: 10% ?Target Date:  09/21/2022 ?Frequency:  Bi-weekly ?Modality:  Cognitive Behavioral Therapy ?Interventions by Therapist:  Therapist will use CBT, Mindfulness exercises, Coping skills and Referrals, as needed by client. ?Client has verbally approved this treatment plan. ? ?Ivan Anchors, [facility] ?

## 2021-10-30 ENCOUNTER — Ambulatory Visit: Payer: 59 | Admitting: Family Medicine

## 2021-10-30 DIAGNOSIS — F988 Other specified behavioral and emotional disorders with onset usually occurring in childhood and adolescence: Secondary | ICD-10-CM | POA: Insufficient documentation

## 2021-10-30 NOTE — Progress Notes (Deleted)
?  Salvadore Dom ?DOB: 1981-05-29 ?Encounter date: 10/30/2021 ? ?This is a 41 y.o. female who presents with ?No chief complaint on file. ? ? ?History of present illness: ?At last visit we continued her Wellbutrin (for ADD as well as depression) And discussed adding on an SSRI versus BuSpar to help with anxiety.  We added BuSpar at that time. ?HPI  ? ?Allergies  ?Allergen Reactions  ? Estrogens   ?  Birth control pills, erythema nodosum  ? ?No outpatient medications have been marked as taking for the 10/30/21 encounter (Appointment) with Caren Macadam, MD.  ? ? ?Review of Systems ? ?Objective: ? ?There were no vitals taken for this visit.     ? ?BP Readings from Last 3 Encounters:  ?08/09/21 112/78  ?05/10/21 102/80  ?04/13/21 122/84  ? ?Wt Readings from Last 3 Encounters:  ?08/09/21 223 lb 14.4 oz (101.6 kg)  ?05/10/21 221 lb 8 oz (100.5 kg)  ?04/13/21 220 lb (99.8 kg)  ? ? ?Physical Exam ? ?Assessment/Plan ? ?There are no diagnoses linked to this encounter. ? ? ? ? ? ? ?Micheline Rough, MD ?

## 2021-11-01 ENCOUNTER — Other Ambulatory Visit: Payer: Self-pay | Admitting: Family Medicine

## 2021-11-02 ENCOUNTER — Ambulatory Visit: Payer: Self-pay | Admitting: Psychology

## 2021-11-05 ENCOUNTER — Other Ambulatory Visit: Payer: Self-pay | Admitting: Family Medicine

## 2021-11-16 ENCOUNTER — Ambulatory Visit (INDEPENDENT_AMBULATORY_CARE_PROVIDER_SITE_OTHER): Payer: 59 | Admitting: Psychology

## 2021-11-16 DIAGNOSIS — F4323 Adjustment disorder with mixed anxiety and depressed mood: Secondary | ICD-10-CM | POA: Diagnosis not present

## 2021-11-16 NOTE — Progress Notes (Signed)
[facility] Counselor/Therapist Progress Note ? ?Patient ID: Brandi Henderson, MRN: [id number],   ? ?Date: 11/16/2021 ? ?Time Spent: 50 mins ? ?Treatment Type: Individual Therapy ? ?Reported Symptoms: Pt presents for follow up session in person and grants consent for the session. ? ?Mental Status Exam: ?Appearance:  Casual     ?Behavior: Appropriate  ?Motor: Normal  ?Speech/Language:  Clear and Coherent  ?Affect: Appropriate  ?Mood: normal  ?Thought process: normal  ?Thought content:   WNL  ?Sensory/Perceptual disturbances:   WNL  ?Orientation: oriented to person, place, and time/date  ?Attention: Good  ?Concentration: Good  ?Memory: WNL  ?Fund of knowledge:  Good  ?Insight:   Good  ?Judgment:  Good  ?Impulse Control: Good  ? ?Risk Assessment: ?Danger to Self:  No ?Self-injurious Behavior: No ?Danger to Others: No ?Duty to Warn:no ?Physical Aggression / Violence:No  ?Access to Firearms a concern: No  ?Gang Involvement:No  ? ?Subjective: Pt shares she has been feeling "generally better" in the past month since we have seen each other (pt had to cancel her appt 2 wks ago for her son's appt.).  Pt shares she has been more productive and "has been getting more accomplished lately."  She travelled to [place] to review another [employer] and enjoyed that work and that time out of town.  She is also going to the beach this weekend with girlfriends and is really looking forward to that trip.  Pt shares she has asked her Head of School to "not make me do fundraising anymore."  They were able to talk through the situation and pt is able to understand why they can't hire another fund raising person right now.  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  Pt has Spring Break next week and after that she will be really busy for a month with fund raising.  Pt shares that Brandi Henderson is doing OK; he saw his new psychiatrist this morning and pt felt good about that visit  and the beginning of the plan.  Brandi Henderson is doing well "as usual.  I never really worry about him."  Brandi Henderson is still busy and is doing well.  Pt talks about the differences between parenting Brandi Henderson and parenting Brandi Henderson, given Brandi Henderson's autism.  Pt shares that she as been sleeping well and enough, working out when she wants to, trying to make sure she rests as much as she can.  Also encouraged pt to be intentional about her self care activities between now and our follow up session in 2 wks. ? ?Interventions: Cognitive Behavioral Therapy ? ?Diagnosis:Adjustment disorder with mixed anxiety and depressed mood ? ?Plan: Treatment Plan ?Strengths/Abilities:  Intelligent, Intuitive, Willing to participate in therapy ?Treatment Preferences:  Outpatient Individual Therapy ?Statement of Needs:  Patient is to use CBT, mindfulness and coping skills to help manage and/or decrease symptoms associated with their diagnosis. ?Symptoms:  Depressed/Irritable mood, worry, social withdrawal ?Problems Addressed:  Depressive thoughts, Sadness, Sleep issues, etc. ?Long Term Goals:  Pt to reduce overall level, frequency, and intensity of the feelings of depression/anxiety as evidenced by decreased irritability, negative self talk, and helpless feelings from 6 to 7 days/week to 0 to 1 days/week, per client report, for at least 3 consecutive months.  Progress: 10% ?Short Term Goals:  Pt to verbally express understanding of the relationship between feelings of depression/anxiety and their impact on thinking patterns and behaviors.  Pt to verbalize an understanding of the  role that distorted thinking plays in creating fears, excessive worry, and ruminations.  Progress: 10% ?Target Date:  09/21/2022 ?Frequency:  Bi-weekly ?Modality:  Cognitive Behavioral Therapy ?Interventions by Therapist:  Therapist will use CBT, Mindfulness exercises, Coping skills and Referrals, as needed by client. ?Client has verbally approved this treatment plan. ? ?Brandi Henderson,  [facility] ?

## 2021-11-30 ENCOUNTER — Ambulatory Visit (INDEPENDENT_AMBULATORY_CARE_PROVIDER_SITE_OTHER): Payer: 59 | Admitting: Psychology

## 2021-11-30 DIAGNOSIS — F4323 Adjustment disorder with mixed anxiety and depressed mood: Secondary | ICD-10-CM

## 2021-11-30 NOTE — Progress Notes (Signed)
[facility] Counselor/Therapist Progress Note ? ?Patient ID: Brandi Henderson, MRN: [id number],   ? ?Date: 11/30/2021 ? ?Time Spent: 50 mins ? ?Treatment Type: Individual Therapy ? ?Reported Symptoms: Pt presents for follow up session in person and grants consent for the session. ? ?Mental Status Exam: ?Appearance:  Casual     ?Behavior: Appropriate  ?Motor: Normal  ?Speech/Language:  Clear and Coherent  ?Affect: Appropriate  ?Mood: normal  ?Thought process: normal  ?Thought content:   WNL  ?Sensory/Perceptual disturbances:   WNL  ?Orientation: oriented to person, place, and time/date  ?Attention: Good  ?Concentration: Good  ?Memory: WNL  ?Fund of knowledge:  Good  ?Insight:   Good  ?Judgment:  Good  ?Impulse Control: Good  ? ?Risk Assessment: ?Danger to Self:  No ?Self-injurious Behavior: No ?Danger to Others: No ?Duty to Warn:no ?Physical Aggression / Violence:No  ?Access to Firearms a concern: No  ?Gang Involvement:No  ? ?Subjective: Pt shares she has been on Spring Break this week (since last Tuesday) and that has been good to have that break.  The boys are good and [name] is doing well as well.  Pt shares she will be busy when she gets back to school after the break.  She is also preparing for the transitional year next school year.  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  Pt is having conflict with the director of K-8 at the school; it is OK because this person is retiring at the end of the year.  Pt is able to see herself and her strengths and abilities as a means of addressing whatever needs there may be.  Pt has been doing things with friends and the kids and [name], went to the [place], etc.  Encouraged pt to be intentional about her self care activities between now and our follow up session in 2 wks. ? ?Interventions: Cognitive Behavioral Therapy ? ?Diagnosis:Adjustment disorder with  mixed anxiety and depressed mood ? ?Plan: Treatment Plan ?Strengths/Abilities:  Intelligent, Intuitive, Willing to participate in therapy ?Treatment Preferences:  Outpatient Individual Therapy ?Statement of Needs:  Patient is to use CBT, mindfulness and coping skills to help manage and/or decrease symptoms associated with their diagnosis. ?Symptoms:  Depressed/Irritable mood, worry, social withdrawal ?Problems Addressed:  Depressive thoughts, Sadness, Sleep issues, etc. ?Long Term Goals:  Pt to reduce overall level, frequency, and intensity of the feelings of depression/anxiety as evidenced by decreased irritability, negative self talk, and helpless feelings from 6 to 7 days/week to 0 to 1 days/week, per client report, for at least 3 consecutive months.  Progress: 10% ?Short Term Goals:  Pt to verbally express understanding of the relationship between feelings of depression/anxiety and their impact on thinking patterns and behaviors.  Pt to verbalize an understanding of the role that distorted thinking plays in creating fears, excessive worry, and ruminations.  Progress: 10% ?Target Date:  09/21/2022 ?Frequency:  Bi-weekly ?Modality:  Cognitive Behavioral Therapy ?Interventions by Therapist:  Therapist will use CBT, Mindfulness exercises, Coping skills and Referrals, as needed by client. ?Client has verbally approved this treatment plan. ? ?Ivan Anchors, [facility] ?

## 2021-12-13 ENCOUNTER — Ambulatory Visit (INDEPENDENT_AMBULATORY_CARE_PROVIDER_SITE_OTHER): Payer: 59 | Admitting: Family Medicine

## 2021-12-13 ENCOUNTER — Encounter: Payer: Self-pay | Admitting: Family Medicine

## 2021-12-13 VITALS — BP 110/76 | HR 79 | Temp 98.5°F | Wt 225.0 lb

## 2021-12-13 DIAGNOSIS — J029 Acute pharyngitis, unspecified: Secondary | ICD-10-CM

## 2021-12-13 DIAGNOSIS — J020 Streptococcal pharyngitis: Secondary | ICD-10-CM | POA: Diagnosis not present

## 2021-12-13 LAB — POC COVID19 BINAXNOW: SARS Coronavirus 2 Ag: NEGATIVE

## 2021-12-13 LAB — POCT RAPID STREP A (OFFICE): Rapid Strep A Screen: POSITIVE — AB

## 2021-12-13 MED ORDER — CEFDINIR 300 MG PO CAPS: 300.0000 mg | ORAL_CAPSULE | Freq: Two times a day (BID) | ORAL | 0 refills | Status: DC

## 2021-12-13 NOTE — Progress Notes (Signed)
? ?  Subjective:  ? ? Patient ID: Brandi Henderson, female    DOB: 06-06-1981, 41 y.o.   MRN: [id number] ? ?HPI ?Here for the onset this morning of a bad ST. No fever or cough. She says Ibuprofen helps. Her son tested positive for strep throat 2 days ago, and he was put on Cefdinir. He is getting better.  ? ? ?Review of Systems  ?Constitutional: Negative.   ?HENT:  Positive for sore throat. Negative for congestion, ear pain and sinus pain.   ?Eyes: Negative.   ?Respiratory: Negative.    ?Gastrointestinal: Negative.   ? ?   ?Objective:  ? Physical Exam ?Constitutional:   ?   Appearance: Normal appearance. She is not ill-appearing.  ?HENT:  ?   Right Ear: Tympanic membrane, ear canal and external ear normal.  ?   Left Ear: Tympanic membrane, ear canal and external ear normal.  ?   Nose: Nose normal.  ?   Mouth/Throat:  ?   Pharynx: Oropharynx is clear. No oropharyngeal exudate or posterior oropharyngeal erythema.  ?Eyes:  ?   Conjunctiva/sclera: Conjunctivae normal.  ?Pulmonary:  ?   Effort: Pulmonary effort is normal.  ?   Breath sounds: Normal breath sounds.  ?Lymphadenopathy:  ?   Cervical: Cervical adenopathy present.  ?Neurological:  ?   Mental Status: She is alert.  ? ? ? ? ? ?   ?Assessment & Plan:  ?Strep pharyngitis, treat with 10 days of Cefdinir. Recheck as needed. ?Alysia Penna, MD ? ? ?

## 2021-12-14 ENCOUNTER — Ambulatory Visit (INDEPENDENT_AMBULATORY_CARE_PROVIDER_SITE_OTHER): Payer: 59 | Admitting: Psychology

## 2021-12-14 DIAGNOSIS — F4323 Adjustment disorder with mixed anxiety and depressed mood: Secondary | ICD-10-CM | POA: Diagnosis not present

## 2021-12-14 NOTE — Progress Notes (Signed)
[facility] Counselor/Therapist Progress Note ? ?Patient ID: Elliet Goodnow, MRN: [id number],   ? ?Date: 12/14/2021 ? ?Time Spent: 50 mins ? ?Treatment Type: Individual Therapy ? ?Reported Symptoms: Pt presents for follow up session in person and grants consent for the session. ? ?Mental Status Exam: ?Appearance:  Casual     ?Behavior: Appropriate  ?Motor: Normal  ?Speech/Language:  Clear and Coherent  ?Affect: Appropriate  ?Mood: normal  ?Thought process: normal  ?Thought content:   WNL  ?Sensory/Perceptual disturbances:   WNL  ?Orientation: oriented to person, place, and time/date  ?Attention: Good  ?Concentration: Good  ?Memory: WNL  ?Fund of knowledge:  Good  ?Insight:   Good  ?Judgment:  Good  ?Impulse Control: Good  ? ?Risk Assessment: ?Danger to Self:  No ?Self-injurious Behavior: No ?Danger to Others: No ?Duty to Warn:no ?Physical Aggression / Violence:No  ?Access to Firearms a concern: No  ?Gang Involvement:No  ? ?Subjective: Pt shares that Bland Span had a tough week last week and they are meeting tomorrow with his teacher and his therapist to try to find out how he is doing.  Pt has been very busy at work and has had less ability to "be there for him."  Pt shares that she was frustrated that she could not stop what she was doing and attend to him.  Pt feels some extra pressure of "being someone in the school" and feels responsible for Bland Span in this situation.  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  She describes passive competition with her and her 4 yr younger brother (Zeke).  Pt shares her mom is on the board at school; she will not be coming to the fund raiser at school because she will be on a trip with Zeke.  Pt feels like her mom does not acknowledge a lot of her accomplishments.  Encouraged pt to be intentional about her self care activities between now and our follow  up session in 2 wks. ? ?Interventions: Cognitive Behavioral Therapy ? ?Diagnosis:Adjustment disorder with mixed anxiety and depressed mood ? ?Plan: Treatment Plan ?Strengths/Abilities:  Intelligent, Intuitive, Willing to participate in therapy ?Treatment Preferences:  Outpatient Individual Therapy ?Statement of Needs:  Patient is to use CBT, mindfulness and coping skills to help manage and/or decrease symptoms associated with their diagnosis. ?Symptoms:  Depressed/Irritable mood, worry, social withdrawal ?Problems Addressed:  Depressive thoughts, Sadness, Sleep issues, etc. ?Long Term Goals:  Pt to reduce overall level, frequency, and intensity of the feelings of depression/anxiety as evidenced by decreased irritability, negative self talk, and helpless feelings from 6 to 7 days/week to 0 to 1 days/week, per client report, for at least 3 consecutive months.  Progress: 10% ?Short Term Goals:  Pt to verbally express understanding of the relationship between feelings of depression/anxiety and their impact on thinking patterns and behaviors.  Pt to verbalize an understanding of the role that distorted thinking plays in creating fears, excessive worry, and ruminations.  Progress: 10% ?Target Date:  09/21/2022 ?Frequency:  Bi-weekly ?Modality:  Cognitive Behavioral Therapy ?Interventions by Therapist:  Therapist will use CBT, Mindfulness exercises, Coping skills and Referrals, as needed by client. ?Client has verbally approved this treatment plan. ? ?Ivan Anchors, [facility] ?

## 2021-12-28 ENCOUNTER — Ambulatory Visit (INDEPENDENT_AMBULATORY_CARE_PROVIDER_SITE_OTHER): Payer: 59 | Admitting: Psychology

## 2021-12-28 DIAGNOSIS — F4323 Adjustment disorder with mixed anxiety and depressed mood: Secondary | ICD-10-CM | POA: Diagnosis not present

## 2021-12-28 NOTE — Progress Notes (Signed)
[facility] Counselor/Therapist Progress Note ? ?Patient ID: Brandi Henderson, MRN: [id number],   ? ?Date: 12/28/2021 ? ?Time Spent: 50 mins ? ?Treatment Type: Individual Therapy ? ?Reported Symptoms: Pt presents for follow up session in person and grants consent for the session. ? ?Mental Status Exam: ?Appearance:  Casual     ?Behavior: Appropriate  ?Motor: Normal  ?Speech/Language:  Clear and Coherent  ?Affect: Appropriate  ?Mood: normal  ?Thought process: normal  ?Thought content:   WNL  ?Sensory/Perceptual disturbances:   WNL  ?Orientation: oriented to person, place, and time/date  ?Attention: Good  ?Concentration: Good  ?Memory: WNL  ?Fund of knowledge:  Good  ?Insight:   Good  ?Judgment:  Good  ?Impulse Control: Good  ? ?Risk Assessment: ?Danger to Self:  No ?Self-injurious Behavior: No ?Danger to Others: No ?Duty to Warn:no ?Physical Aggression / Violence:No  ?Access to Firearms a concern: No  ?Gang Involvement:No  ? ?Subjective: Pt shares that her fund raising event went really well and was very effective.  She is taking today and tomorrow off to recover.  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  She wants to put together a "Vision Statement" to give to the Board members that she will be talking to.  Pt acknowledges that they need to develop a succession plan for the head of school position, her position and other open positions.  Pt shares that her kids will be in different camps for the summer and will spend some time with her mom as well.  Her mom did not attend the fund raising event because she was on a trip to visit pt's brother in [place].  Pt is disappointed that she missed the event because she chose to visit her brother.  Pt does acknowledge that her mom does a lot for pt and the kids.  Pt mentions it has always been difficult for her to ask for help from others.  Asked pt to  reflect on why this is the case; did she receive messages in the past the it was no OK to ask for help? If so, from whom?  Encouraged pt to be intentional about her self care activities between now and our follow up session in 2 wks. ? ?Interventions: Cognitive Behavioral Therapy ? ?Diagnosis:Adjustment disorder with mixed anxiety and depressed mood ? ?Plan: Treatment Plan ?Strengths/Abilities:  Intelligent, Intuitive, Willing to participate in therapy ?Treatment Preferences:  Outpatient Individual Therapy ?Statement of Needs:  Patient is to use CBT, mindfulness and coping skills to help manage and/or decrease symptoms associated with their diagnosis. ?Symptoms:  Depressed/Irritable mood, worry, social withdrawal ?Problems Addressed:  Depressive thoughts, Sadness, Sleep issues, etc. ?Long Term Goals:  Pt to reduce overall level, frequency, and intensity of the feelings of depression/anxiety as evidenced by decreased irritability, negative self talk, and helpless feelings from 6 to 7 days/week to 0 to 1 days/week, per client report, for at least 3 consecutive months.  Progress: 10% ?Short Term Goals:  Pt to verbally express understanding of the relationship between feelings of depression/anxiety and their impact on thinking patterns and behaviors.  Pt to verbalize an understanding of the role that distorted thinking plays in creating fears, excessive worry, and ruminations.  Progress: 10% ?Target Date:  09/21/2022 ?Frequency:  Bi-weekly ?Modality:  Cognitive Behavioral Therapy ?Interventions by Therapist:  Therapist will use CBT, Mindfulness exercises, Coping skills and Referrals, as  needed by client. ?Client has verbally approved this treatment plan. ? ?Ivan Anchors, [facility] ?

## 2022-01-11 ENCOUNTER — Ambulatory Visit: Payer: Self-pay | Admitting: Psychology

## 2022-01-22 IMAGING — DX DG FOOT COMPLETE 3+V*R*
3 series · 3 of 3 positions shown · non-contrast
Comparison: None.

CLINICAL DATA: Right lateral foot pain.  Recent injury.

EXAM:
RIGHT FOOT COMPLETE - 3+ VIEW

[foot ap]
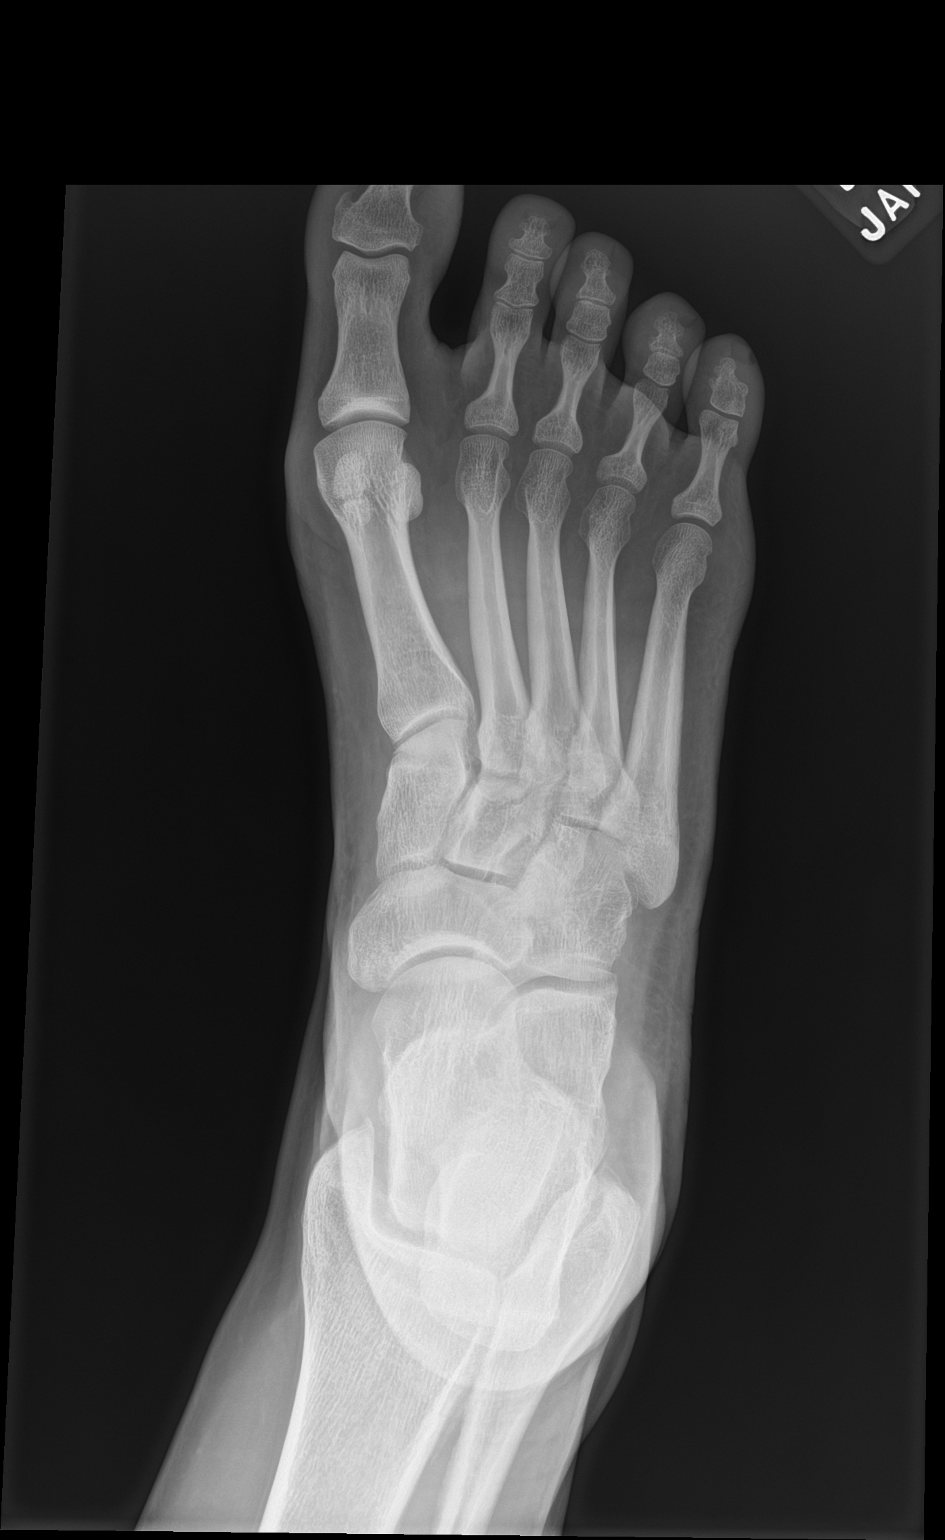

[foot obl]
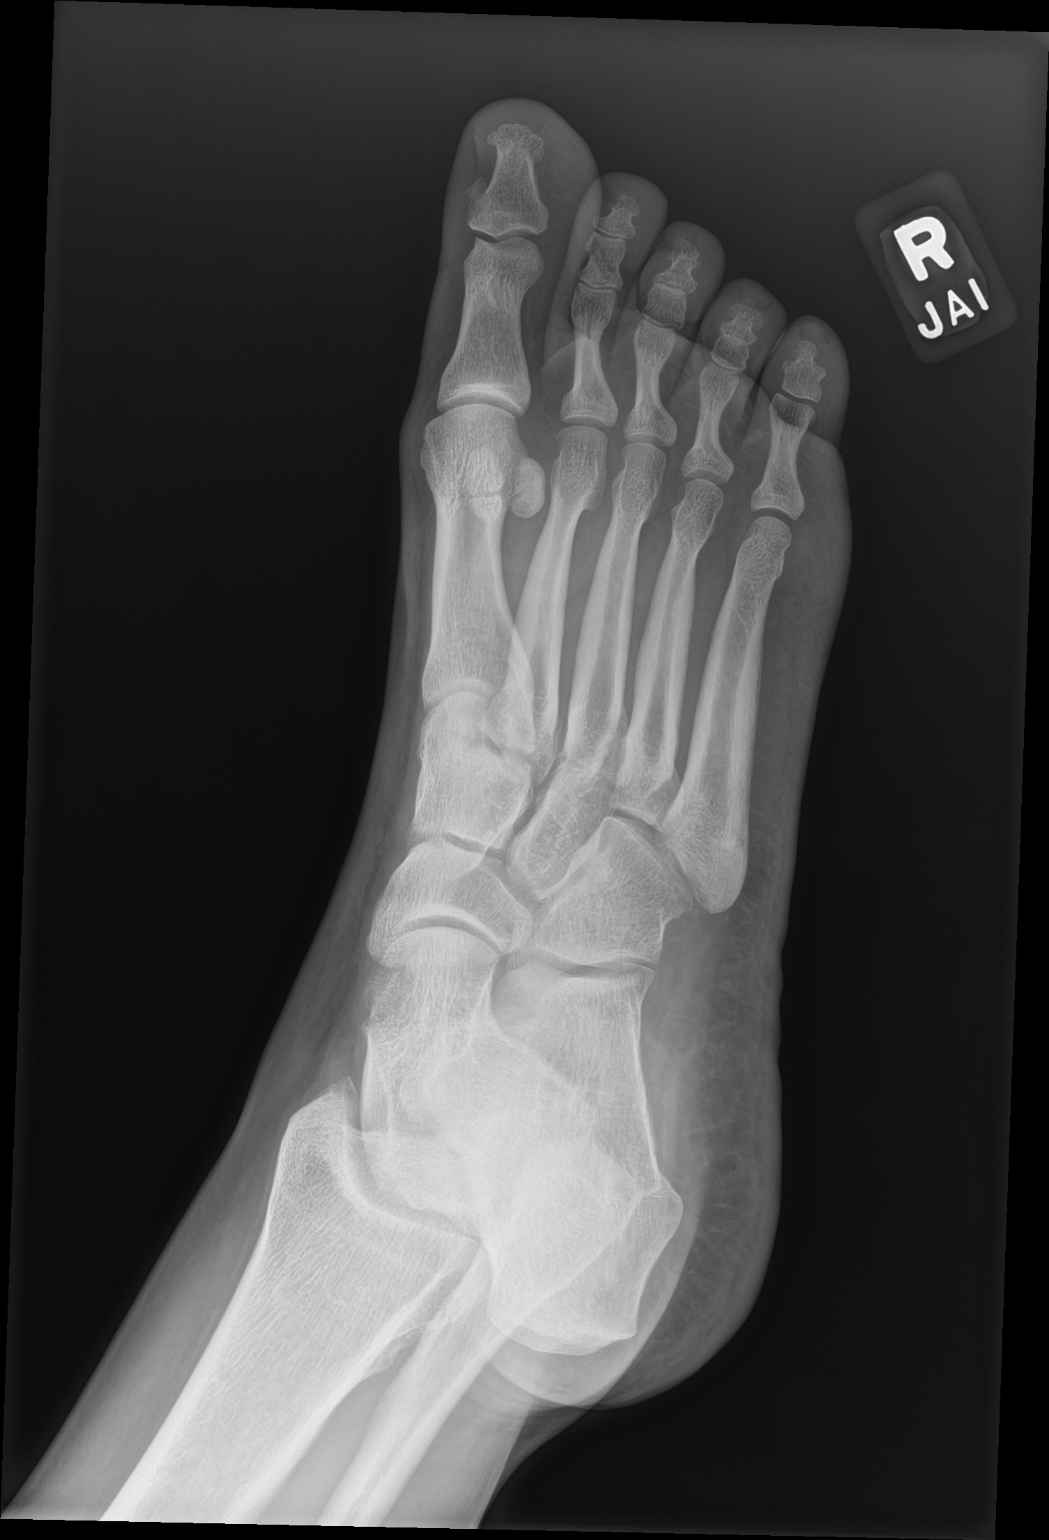

[foot lat]
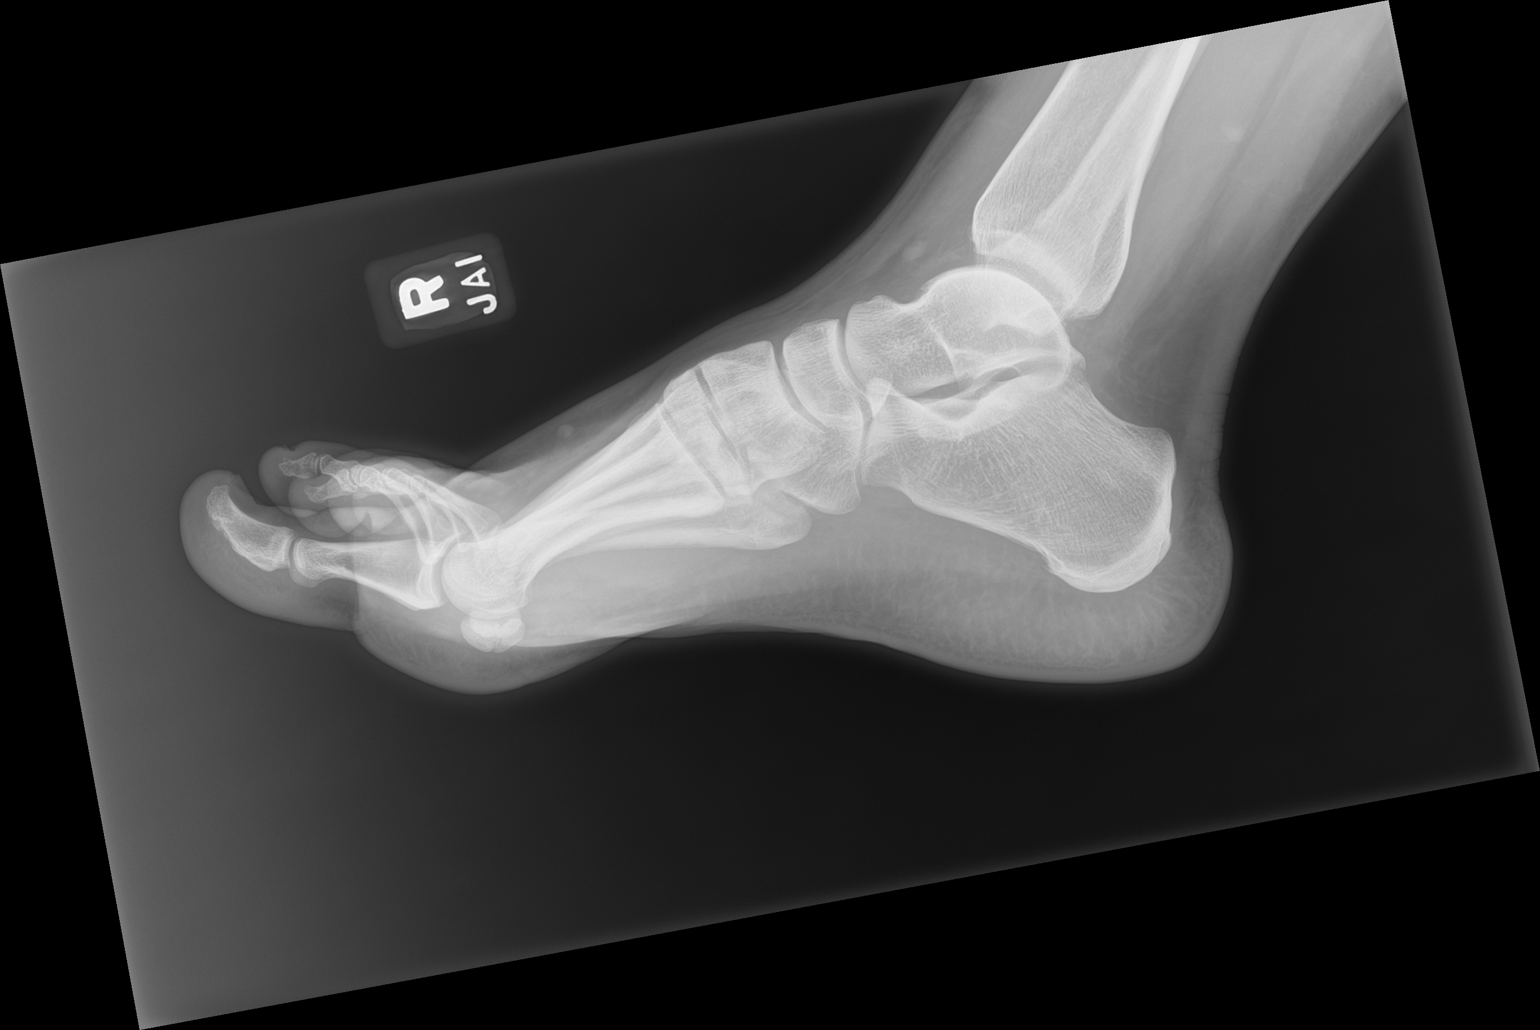

[3 of 3 positions shown; findings below may reference images not displayed]

FINDINGS: There is no evidence of fracture or dislocation. There is no
evidence of arthropathy or other focal bone abnormality. Soft
tissues are unremarkable.
IMPRESSION: Negative.

## 2022-01-25 ENCOUNTER — Ambulatory Visit (INDEPENDENT_AMBULATORY_CARE_PROVIDER_SITE_OTHER): Payer: 59 | Admitting: Psychology

## 2022-01-25 DIAGNOSIS — F4323 Adjustment disorder with mixed anxiety and depressed mood: Secondary | ICD-10-CM

## 2022-01-25 NOTE — Progress Notes (Signed)
[facility] Counselor/Therapist Progress Note  Patient ID: Brandi Henderson, MRN: [id number],    Date: 01/25/2022  Time Spent: 50 mins  Treatment Type: Individual Therapy  Reported Symptoms: Pt presents for follow up session in person and grants consent for the session.  Mental Status Exam: Appearance:  Casual     Behavior: Appropriate  Motor: Normal  Speech/Language:  Clear and Coherent  Affect: Appropriate  Mood: normal  Thought process: normal  Thought content:   WNL  Sensory/Perceptual disturbances:   WNL  Orientation: oriented to person, place, and time/date  Attention: Good  Concentration: Good  Memory: WNL  Fund of knowledge:  Good  Insight:   Good  Judgment:  Good  Impulse Control: Good   Risk Assessment: Danger to Self:  No Self-injurious Behavior: No Danger to Others: No Duty to Warn:no Physical Aggression / Violence:No  Access to Firearms a concern: No  Gang Involvement:No   Subjective: Pt shares that she has finished her interviews and they went well.  She is waiting for the contract which should be forthcoming.  Pt plans to have an attorney review the contract before she signs it.  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  School is now out and the teachers are now finished as well.  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  The school has sent out notifications that the current head of school will be retiring after next year.  [name] is doing summer camp at school and Bland Span is going to camps and spending time with his grandparents, etc.  Bland Span seems to be doing well with his new medication.  Pt shares she has been reading, sleeping in, spending time with friends, etc as self care activities.  She is looking forward to being in [facility] for 2 wks this  summer.  They are also going to the beach with her family this summer for 2 wks in June.  Encouraged pt to continue with her self care activities through the summer and we will reconvene at the end of August for additional sessions.  Interventions: Cognitive Behavioral Therapy  Diagnosis:Adjustment disorder with mixed anxiety and depressed mood  Plan: Treatment Plan Strengths/Abilities:  Intelligent, Intuitive, Willing to participate in therapy Treatment Preferences:  Outpatient Individual Therapy Statement of Needs:  Patient is to use CBT, mindfulness and coping skills to help manage and/or decrease symptoms associated with their diagnosis. Symptoms:  Depressed/Irritable mood, worry, social withdrawal Problems Addressed:  Depressive thoughts, Sadness, Sleep issues, etc. Long Term Goals:  Pt to reduce overall level, frequency, and intensity of the feelings of depression/anxiety as evidenced by decreased irritability, negative self talk, and helpless feelings from 6 to 7 days/week to 0 to 1 days/week, per client report, for at least 3 consecutive months.  Progress: 10% Short Term Goals:  Pt to verbally express understanding of the relationship between feelings of depression/anxiety and their impact on thinking patterns and behaviors.  Pt to verbalize an understanding of the role that distorted thinking plays in creating fears, excessive worry, and ruminations.  Progress: 10% Target Date:  09/21/2022 Frequency:  Bi-weekly Modality:  Cognitive Behavioral Therapy Interventions by Therapist:  Therapist will use CBT, Mindfulness exercises, Coping skills and Referrals, as needed by client. Client has verbally approved  this treatment plan.  Ivan Anchors, [facility]

## 2022-01-30 ENCOUNTER — Other Ambulatory Visit: Payer: Self-pay | Admitting: *Deleted

## 2022-01-30 MED ORDER — BUPROPION HCL ER (XL) 300 MG PO TB24: 300.0000 mg | ORAL_TABLET | Freq: Every day | ORAL | 0 refills | Status: DC

## 2022-01-31 ENCOUNTER — Other Ambulatory Visit: Payer: Self-pay | Admitting: *Deleted

## 2022-01-31 MED ORDER — BUSPIRONE HCL 15 MG PO TABS: ORAL_TABLET | ORAL | 2 refills | Status: DC

## 2022-02-08 ENCOUNTER — Ambulatory Visit: Payer: 59 | Admitting: Psychology

## 2022-02-22 ENCOUNTER — Ambulatory Visit: Payer: 59 | Admitting: Psychology

## 2022-03-08 ENCOUNTER — Ambulatory Visit: Payer: 59 | Admitting: Psychology

## 2022-03-16 ENCOUNTER — Encounter: Payer: Self-pay | Admitting: Physician Assistant

## 2022-03-16 ENCOUNTER — Ambulatory Visit (INDEPENDENT_AMBULATORY_CARE_PROVIDER_SITE_OTHER): Payer: 59 | Admitting: Physician Assistant

## 2022-03-16 VITALS — BP 112/78 | HR 84 | Temp 98.2°F | Ht 72.0 in | Wt 232.2 lb

## 2022-03-16 DIAGNOSIS — R69 Illness, unspecified: Secondary | ICD-10-CM | POA: Diagnosis not present

## 2022-03-16 DIAGNOSIS — E6609 Other obesity due to excess calories: Secondary | ICD-10-CM | POA: Diagnosis not present

## 2022-03-16 DIAGNOSIS — Z6831 Body mass index (BMI) 31.0-31.9, adult: Secondary | ICD-10-CM | POA: Diagnosis not present

## 2022-03-16 DIAGNOSIS — F419 Anxiety disorder, unspecified: Secondary | ICD-10-CM

## 2022-03-16 MED ORDER — BUSPIRONE HCL 15 MG PO TABS
15.0000 mg | ORAL_TABLET | Freq: Three times a day (TID) | ORAL | 1 refills | Status: AC
Start: 2022-03-16 — End: 2022-06-14

## 2022-03-16 NOTE — Patient Instructions (Signed)
Great to meet you today!  Increase Buspar to three times daily (add a 1/2 or full tab at lunch). Let me know if this is helping with your anxiety. We can always consider SSRI in the future for additional help if needed.  Keep working on lifestyle goals. Try tracking calories. More to come on weight loss medications in the near future.

## 2022-03-16 NOTE — Progress Notes (Signed)
Subjective:    Patient ID: Brandi Henderson, female    DOB: 09/20/1980, 41 y.o.   MRN: [id number]  Chief Complaint  Patient presents with   transfer of care    Pt in to establish care with PCP; pt is due for annual CPE; pt is not fasting; pt states taking Buspar and not sure her dosage is right, pt says it was working well however now seems her anxiety level is increasing more;     HPI 41 y.o. patient presents today for new patient establishment with me.  Patient was previously established with Dr. Ethlyn Gallery. Two kids, 8 & 6 keeping her busy. Works in the school system.  Current Care Team: GYN for female care   Acute Concerns: Anxiety - Buspar doesn't seem to be helping as much as it used to, wondering about changing dose. Feeling more on edge lately. Wellbutrin XL 300 mg still working well.   Also concerned about difficulty losing weight. States it has been harder over the years since having kids and approaching 74. Eats well overall. Works out 4 times per week. Still not having much success on the scales.    Past Medical History:  Diagnosis Date   Allergy    Erythema nodosum 2001   with OCPs and Mononucleosis prior   Hx of varicella    IUD (intrauterine device) in place 2014   copper    LGA (large for gestational age) fetus affecting mother, antepartum 03/13/2016   LGA (large for gestational age) fetus affecting mother, antepartum 03/13/2016   Meralgia paresthetica of right side 2015   Postpartum care following vaginal delivery (7/25) 03/14/2016   Routine gynecological examination    Dr. Orvan Seen, [facility]   SVD (spontaneous vaginal delivery) 03/13/2016   Wears glasses     Past Surgical History:  Procedure Laterality Date   CYST REMOVAL HAND     ganglion, right   WISDOM TOOTH EXTRACTION      Family History  Problem Relation Age of Onset   Crohn's disease Brother    Macular degeneration Father    Cancer Maternal Grandfather    Diabetes Paternal  Grandmother    Heart disease Paternal Grandmother    Macular degeneration Paternal Grandmother    Stroke Neg Hx    Hypertension Neg Hx    Hyperlipidemia Neg Hx     Social History   Tobacco Use   Smoking status: Never   Smokeless tobacco: Never  Vaping Use   Vaping Use: Never used  Substance Use Topics   Alcohol use: Yes    Alcohol/week: 2.0 standard drinks of alcohol    Types: 1 Glasses of wine, 1 Cans of beer per week   Drug use: No     Allergies  Allergen Reactions   Estrogens     Birth control pills, erythema nodosum    Review of Systems NEGATIVE UNLESS OTHERWISE INDICATED IN HPI      Objective:     BP 112/78 (BP Location: Left Arm)   Pulse 84   Temp 98.2 F (36.8 C) (Temporal)   Ht 6' (1.829 m)   Wt 232 lb 3.2 oz (105.3 kg)   LMP 02/19/2022 (Approximate)   SpO2 98%   BMI 31.49 kg/m   Wt Readings from Last 3 Encounters:  03/16/22 232 lb 3.2 oz (105.3 kg)  12/13/21 225 lb (102.1 kg)  08/09/21 223 lb 14.4 oz (101.6 kg)    BP Readings from Last 3 Encounters:  03/16/22 112/78  12/13/21 110/76  08/09/21 112/78     Physical Exam Constitutional:      Appearance: Normal appearance. She is obese.  Eyes:     Extraocular Movements: Extraocular movements intact.     Conjunctiva/sclera: Conjunctivae normal.     Pupils: Pupils are equal, round, and reactive to light.  Cardiovascular:     Rate and Rhythm: Normal rate and regular rhythm.  Pulmonary:     Effort: Pulmonary effort is normal.     Breath sounds: Normal breath sounds.  Neurological:     General: No focal deficit present.     Mental Status: She is alert and oriented to person, place, and time.  Psychiatric:        Mood and Affect: Mood normal.        Behavior: Behavior normal.        Thought Content: Thought content normal.        Assessment & Plan:   Problem List Items Addressed This Visit       Other   Anxiety - Primary   Relevant Medications   busPIRone (BUSPAR) 15 MG tablet    Other Visit Diagnoses     Class 1 obesity due to excess calories without serious comorbidity with body mass index (BMI) of 31.0 to 31.9 in adult            Meds ordered this encounter  Medications   busPIRone (BUSPAR) 15 MG tablet    Sig: Take 1 tablet (15 mg total) by mouth 3 (three) times daily.    Dispense:  270 tablet    Refill:  1    Order Specific Question:   Supervising Provider    Answer:   Yong Channel, STEPHEN O [[id number]]   1. Anxiety Cont Wellbutrin XL 300 mg daily Increase buspar to 15 mg TID Consider SSRI Keep working on natural stress reducers at home  2. Class 1 obesity due to excess calories without serious comorbidity with body mass index (BMI) of 31.0 to 31.9 in adult Discussed GLP-1 injections, which would be excellent help, but she does not qualify at this time. Could consider Saxenda, but once daily injection less desirable. Would not suggest oral medications at this time. Labs have all been good. Encouraged continued work on lifestyle changes, maybe a Physiological scientist + nutritionist.    Return in about 6 months (around 09/16/2022) for fasting labs, CPE .   Brandi Brinegar M Taylr Meuth, PA-C

## 2022-03-29 ENCOUNTER — Telehealth: Payer: Self-pay | Admitting: Licensed Clinical Social Worker

## 2022-03-29 NOTE — Telephone Encounter (Signed)
Scheduled appt per 8/7 staff msg with Brandi Henderson. Pt had called in stating she had a letter to have genetic testing done before 9/6. Scheduled a virtual visit with Faith Rogue due to no availability at [facility]. Pt is aware of virtual appt date/time and instructions were given for logging into MyChart the day of the appt.

## 2022-04-05 ENCOUNTER — Encounter: Payer: Self-pay | Admitting: Licensed Clinical Social Worker

## 2022-04-05 ENCOUNTER — Inpatient Hospital Stay: Payer: 59 | Attending: Oncology | Admitting: Licensed Clinical Social Worker

## 2022-04-05 DIAGNOSIS — Z8481 Family history of carrier of genetic disease: Secondary | ICD-10-CM | POA: Diagnosis not present

## 2022-04-05 NOTE — Progress Notes (Signed)
REFERRING PROVIDER: Self-referred  PRIMARY PROVIDER:  Allwardt, Alyssa M, PA-C  PRIMARY REASON FOR VISIT:  1. Family history of gene mutation    I connected with Brandi Henderson on 04/05/2022 at 11:00 AM EDT by MyChart video conference and verified that I am speaking with the correct person using two identifiers.    Patient location: home Provider location: [place]:   Brandi Henderson, a 41 y.o. female, was seen for a [place] cancer genetics consultation due to her mother's recent genetic testing that showed an APC moderate risk mutation.  Brandi Henderson presents to clinic today to discuss the possibility of a hereditary predisposition to cancer, genetic testing, and to further clarify her future cancer risks, as well as potential cancer risks for family members.   CANCER HISTORY:  Brandi Henderson is a 41 y.o. female with no personal history of cancer.    RISK FACTORS:  Menarche was at age 63-13.  First live birth at age 75.  Ovaries intact: yes.  Hysterectomy: no.  Menopausal status: premenopausal.  HRT use: 0 years. Colonoscopy: no; not examined. Mammogram within the last year: no. Number of breast biopsies: 0. Up to date with pelvic exams: yes  Past Medical History:  Diagnosis Date   Allergy    Erythema nodosum 2001   with OCPs and Mononucleosis prior   Hx of varicella    IUD (intrauterine device) in place 2014   copper    LGA (large for gestational age) fetus affecting mother, antepartum 03/13/2016   LGA (large for gestational age) fetus affecting mother, antepartum 03/13/2016   Meralgia paresthetica of right side 2015   Postpartum care following vaginal delivery (7/25) 03/14/2016   Routine gynecological examination    Dr. Orvan Se[facility], [facility]   SVD (spontaneous vaginal delivery) 03/13/2016   Wears glasses     Past Surgical History:  Procedure Laterality Date   CYST REMOVAL HAND     ganglion,  right   WISDOM TOOTH EXTRACTION      FAMILY HISTORY:  We obtained a detailed, 4-generation family history.  Significant diagnoses are listed below: Family History  Problem Relation Age of Onset   Other Mother        APC moderate risk mutation   Macular degeneration Father    Crohn's disease Brother    Brain cancer Paternal Uncle 34       glioblastoma   Non-Hodgkin's lymphoma Maternal Grandfather    Diabetes Paternal Grandmother    Heart disease Paternal Grandmother    Macular degeneration Paternal Grandmother    Stroke Neg Hx    Hypertension Neg Hx    Hyperlipidemia Neg Hx    Brandi Henderson has 2 sons, 8 and 6. She has 1 brother, 54.  Brandi Henderson mother recently underwent genetic testing and was found to have a mutation in APC called p.I1307K. This is a moderate risk mutation. Patient's maternal grandfather died of Non Hodgkin's lymphoma. He had 2 sisters that had breast cancer. Maternal grandmother passed at 97, her sister had breast cancer and father had colon cancer in his 74s.  Brandi Henderson father is living at 73. Patient has 1 paternal uncle, he was recently diagnosed with a glioblastoma. No other known cancers on this side of the family.  Brandi Henderson is aware of previous family history of genetic testing for hereditary cancer risks. There is reported Ashkenazi Jewish ancestry (patient's mother's side). There is no known consanguinity.    GENETIC COUNSELING ASSESSMENT:  Brandi Henderson is a 41 y.o. female with a family history of an APC moderate risk mutation.  We, therefore, discussed and recommended the following at today's visit.   DISCUSSION: We discussed that approximately 10% of  cancer is hereditary. We discussed the moderate risk APC mutation found in her mother, noting that the risk for colon cancer with this particular mutation is thought to be about 5-10% (much lower than other APC mutations). There are other genes associated  with hereditary  cancer as well. Cancers and risks are gene specific. We discussed that testing is beneficial for several reasons including knowing about cancer risks, identifying potential screening and risk-reduction options that may be appropriate, and to understand if other family members could be at risk for cancer and allow them to undergo genetic testing.   We reviewed the characteristics, features and inheritance patterns of hereditary cancer syndromes. We also discussed genetic testing, including the appropriate family members to test, the process of testing, insurance coverage and turn-around-time for results. We discussed the implications of a negative, positive and/or variant of uncertain significant result. We recommended Brandi Henderson pursue genetic testing for at least the known familial APC mutation, but could also pursue testing for the [employer] CancerNext-Expanded+RNA gene panel.   Based on Brandi Henderson's family history of an APC mutation,  she meets medical criteria for genetic testing. Despite that she meets criteria, she may still have an out of pocket cost. We discussed that if her out of pocket cost for testing is over $100, the laboratory will call and confirm whether she wants to proceed with testing.  If the out of pocket cost of testing is less than $100 she will be billed by the genetic testing laboratory.   PLAN: After considering the risks, benefits, and limitations, Brandi Henderson provided informed consent to pursue genetic testing. She will have blood drawn on 8/25 and the blood sample will be sent to [facility] for analysis of the CancerNext-Expanded+RNA panel. Results should be available within approximately 2-3 weeks' time, at which point they will be disclosed by telephone to Brandi Henderson, as will any additional recommendations warranted by these results. Brandi Henderson will receive a summary of her genetic counseling visit and a copy  of her results once available. This information will also be available in Epic.   Brandi Henderson questions were answered to her satisfaction today. Our contact information was provided should additional questions or concerns arise. Thank you for the referral and allowing [place] to share in the care of your patient.   Faith Rogue, MS, [facility] Genetic Counselor [email or web address] Phone: [phone]  The patient was seen for a total of 20 minutes in virtual genetic counseling.  Dr. Grayland Ormond was available for discussion regarding this case.   _______________________________________________________________________ For Office Staff:  Number of people involved in session: 1 Was an Intern/ student involved with case: no

## 2022-04-13 ENCOUNTER — Inpatient Hospital Stay (HOSPITAL_BASED_OUTPATIENT_CLINIC_OR_DEPARTMENT_OTHER): Payer: 59

## 2022-04-13 ENCOUNTER — Other Ambulatory Visit: Payer: Self-pay

## 2022-04-13 LAB — GENETIC SCREENING ORDER

## 2022-04-19 ENCOUNTER — Ambulatory Visit: Payer: 59 | Admitting: Psychology

## 2022-05-01 ENCOUNTER — Other Ambulatory Visit: Payer: Self-pay

## 2022-05-01 MED ORDER — BUPROPION HCL ER (XL) 300 MG PO TB24: 300.0000 mg | ORAL_TABLET | Freq: Every day | ORAL | 0 refills | Status: DC

## 2022-05-02 ENCOUNTER — Telehealth: Payer: Self-pay | Admitting: Licensed Clinical Social Worker

## 2022-05-03 ENCOUNTER — Encounter: Payer: Self-pay | Admitting: Licensed Clinical Social Worker

## 2022-05-03 ENCOUNTER — Ambulatory Visit: Payer: Self-pay | Admitting: Licensed Clinical Social Worker

## 2022-05-03 ENCOUNTER — Ambulatory Visit: Payer: 59 | Admitting: Psychology

## 2022-05-03 DIAGNOSIS — Z1379 Encounter for other screening for genetic and chromosomal anomalies: Secondary | ICD-10-CM | POA: Insufficient documentation

## 2022-05-03 NOTE — Progress Notes (Signed)
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 Please refer to our prior cancer genetics clinic note for more information regarding our discussion, assessment and recommendations, at the time. Brandi Henderson recent genetic test results were disclosed to her, as were recommendations warranted by these results. These results and recommendations are discussed in more detail below.  CANCER HISTORY:  Oncology History   No history exists.    FAMILY HISTORY:  We obtained a detailed, 4-generation family history.  Significant diagnoses are listed below: Family History  Problem Relation Age of Onset   Other Mother        APC moderate risk mutation   Macular degeneration Father    Crohn's disease Brother    Brain cancer Paternal Uncle 61       glioblastoma   Non-Hodgkin's lymphoma Maternal Henderson    Diabetes Paternal Grandmother    Heart disease Paternal Grandmother    Macular degeneration Paternal Grandmother    Stroke Neg Hx    Hypertension Neg Hx    Hyperlipidemia Neg Hx    Brandi Henderson has 2 sons, 8 and 6. She has 1 brother, 22.   Brandi Henderson mother recently underwent genetic testing and was found to have a mutation in APC called p.I1307K. This is a moderate risk mutation. Brandi Henderson died of Non Hodgkin's lymphoma. He had 2 sisters that had breast cancer. Maternal grandmother passed at 10, her sister had breast cancer and father had colon cancer in his 33s.   Brandi Henderson father is living at 59. Patient has 1 paternal uncle, he was recently diagnosed with a glioblastoma. No other known cancers on this side of the family.   Brandi Henderson is aware of previous family history of  genetic testing for hereditary cancer risks. There is reported Ashkenazi Jewish ancestry (Brandi mother's side). There is no known consanguinity.     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 The remainder of testing was negative/normal.  The CancerNext-Expanded + RNAinsight gene panel offered by [employer] and includes sequencing and rearrangement analysis for the following 77 genes: IP, ALK, APC*, ATM*, AXIN2, BAP1, BARD1, BLM, BMPR1A, BRCA1*, BRCA2*, BRIP1*, CDC73, CDH1*,CDK4, CDKN1B, CDKN2A, CHEK2*, CTNNA1, DICER1, FANCC, FH, FLCN, GALNT12, KIF1B, LZTR1, MAX, MEN1, MET, MLH1*, MSH2*, MSH3, MSH6*, MUTYH*, NBN, NF1*, NF2, NTHL1, PALB2*, PHOX2B, PMS2*, POT1, PRKAR1A, PTCH1, PTEN*, RAD51C*, RAD51D*,RB1, RECQL, RET, SDHA, SDHAF2, SDHB, SDHC, SDHD, SMAD4, SMARCA4, SMARCB1, SMARCE1, STK11, SUFU, TMEM127, TP53*,TSC1, TSC2, VHL and XRCC2 (sequencing and deletion/duplication); EGFR, EGLN1, HOXB13, KIT, MITF, PDGFRA, POLD1 and POLE (sequencing only); EPCAM and GREM1 (deletion/duplication only).  The test report has been scanned into EPIC and is located under the Molecular Pathology section of the Results Review tab.  A portion of the result report is included below for reference. Genetic testing reported out on 04/26/2022.      Genetic testing identified a variant of uncertain significance (VUS) in the APC gene called c.7475C>T.  At this time, it is unknown if this variant is associated with an increased risk for cancer or if it is benign, but most uncertain variants are reclassified to benign. It should not be used to make medical management decisions. With time, we suspect the laboratory will determine  the significance of this variant, if any. If the laboratory reclassifies this variant, we will attempt to contact Brandi Henderson to discuss it further.   Cancer Risks for the p.I1307K variant in the APC gene: Colon  cancer, 5-10% risk   This information is based on current understanding of the gene and may change in the future.   Management Recommendations: Colonoscopy screening every 5 years beginning at age 76 If an individual has a first-degree relative with colorectal cancer, screening should begin 10 years prior to the relative's age at diagnosis or at age 8, whichever comes first. Brandi Henderson would like to be referred to [facility] to establish care and to be followed for this indication.   Implications for Family Members: Hereditary predisposition to cancer due to pathogenic variants in the APC gene has autosomal dominant inheritance. This means that an individual with a pathogenic variant has a 50% chance of passing the condition on to his/her offspring. Identification of a pathogenic variant allows for the recognition of at-risk relatives who can pursue testing for the familial variant.   Family members are encouraged to consider genetic testing for this familial pathogenic variant. As there are generally no childhood cancer risks associated with pathogenic variants in the APC gene p. I1307K pathogenic variant, individuals in the family are not recommended to have testing until they reach at least 40 years of age. They may contact our office at [phone] for more information or to schedule an appointment.  Complimentary testing for the familial variant is available for 90 days from the report date.  Family members who live outside of the area are encouraged to find a genetic counselor in their area by visiting: [email or web address].    FOLLOW-UP:  Lastly, we discussed with Brandi Henderson that cancer genetics is a rapidly advancing field and it is possible that new genetic tests will be appropriate for her and/or her family members in the future. We encouraged her to remain in contact with cancer genetics on an annual basis so we can update her personal and family  histories and let her know of advances in cancer genetics that may benefit this family.   Our contact number was provided. Brandi Henderson questions were answered to her satisfaction, and she knows she is welcome to call us at anytime with additional questions or concerns.    Faith Rogue, MS, [facility] Genetic Counselor [email or web address] Phone: [phone]

## 2022-05-03 NOTE — Telephone Encounter (Signed)
I contacted Ms. Feibelman-Turner to discuss her genetic testing results. 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 No other variants were identified in the 77 genes analyzed. Detailed clinic note to follow.   The test report has been scanned into EPIC and is located under the Molecular Pathology section of the Results Review tab.  A portion of the result report is included below for reference.      Faith Rogue, MS, [facility] Genetic Counselor [email or web address] Phone: [phone]

## 2022-05-04 ENCOUNTER — Telehealth: Payer: Self-pay

## 2022-05-04 NOTE — Telephone Encounter (Signed)
Pt scheduled for 06/21/22 at 8:20 am with Dr. Bryan Lemma. Pt verbalized understanding and had no other concerns at end of call.

## 2022-05-04 NOTE — Telephone Encounter (Signed)
-----   Message from Lavena Bullion, DO sent at 05/03/2022  4:28 PM EDT ----- Serita Butcher. No problem. We can certainly get her in. Thanks!  Mickel Baas, Can you please assist in OV with me for this patient to discuss colonoscopy for APC mutation? Thanks.   ----- Message ----- From: Faith Rogue T Sent: 05/03/2022  12:46 PM EDT To: Lavena Bullion, DO  Hi Dr. Bryan Lemma!  This patient tested positive for moderate risk APC mutation and wanted to be seen by you all for colonoscopy since she is 41. Would you be able to see her for this?  Thanks! Denton Ar

## 2022-05-06 ENCOUNTER — Encounter: Payer: Self-pay | Admitting: Physician Assistant

## 2022-05-14 ENCOUNTER — Encounter: Payer: Self-pay | Admitting: *Deleted

## 2022-05-16 ENCOUNTER — Other Ambulatory Visit: Payer: Self-pay

## 2022-05-16 MED ORDER — BUPROPION HCL ER (XL) 300 MG PO TB24: 300.0000 mg | ORAL_TABLET | Freq: Every day | ORAL | 0 refills | Status: DC

## 2022-05-16 NOTE — Telephone Encounter (Signed)
Prescription sent on 05/01/22 to [facility] was never picked up, pharmacy cancelled prescription and new prescription sent to [facility] as requested by patient.

## 2022-05-31 ENCOUNTER — Ambulatory Visit: Payer: 59 | Admitting: Psychology

## 2022-06-14 ENCOUNTER — Ambulatory Visit: Payer: 59 | Admitting: Psychology

## 2022-06-21 ENCOUNTER — Ambulatory Visit (INDEPENDENT_AMBULATORY_CARE_PROVIDER_SITE_OTHER): Payer: 59 | Admitting: Gastroenterology

## 2022-06-21 ENCOUNTER — Encounter: Payer: Self-pay | Admitting: Gastroenterology

## 2022-06-21 VITALS — BP 130/88 | HR 81 | Ht 72.0 in | Wt 236.6 lb

## 2022-06-21 DIAGNOSIS — Z800 Family history of malignant neoplasm of digestive organs: Secondary | ICD-10-CM | POA: Diagnosis not present

## 2022-06-21 DIAGNOSIS — Z1589 Genetic susceptibility to other disease: Secondary | ICD-10-CM

## 2022-06-21 DIAGNOSIS — Z1211 Encounter for screening for malignant neoplasm of colon: Secondary | ICD-10-CM | POA: Diagnosis not present

## 2022-06-21 DIAGNOSIS — Z83719 Family history of colon polyps, unspecified: Secondary | ICD-10-CM

## 2022-06-21 MED ORDER — CLENPIQ 10-3.5-12 MG-GM -GM/175ML PO SOLN: 1.0000 | Freq: Once | ORAL | 0 refills | Status: AC

## 2022-06-21 NOTE — Patient Instructions (Signed)
We have sent the following medications to your pharmacy for you to pick up at your convenience:  You have been scheduled for a colonoscopy. Please follow written instructions given to you at your visit today.  Please pick up your prep supplies at the pharmacy within the next 1-3 days. If you use inhalers (even only as needed), please bring them with you on the day of your procedure.    If you are age 40 or younger, your body mass index should be between 19-25. Your Body mass index is 32.09 kg/m. If this is out of the aformentioned range listed, please consider follow up with your Primary Care Provider.   __________________________________________________________  The [facility] providers would like to encourage you to use [facility] to communicate with providers for non-urgent requests or questions.  Due to long hold times on the telephone, sending your provider a message by [facility] may be a faster and more efficient way to get a response.  Please allow 48 business hours for a response.  Please remember that this is for non-urgent requests.   Due to recent changes in healthcare laws, you may see the results of your imaging and laboratory studies on MyChart before your provider has had a chance to review them.  We understand that in some cases there may be results that are confusing or concerning to you. Not all laboratory results come back in the same time frame and the provider may be waiting for multiple results in order to interpret others.  Please give [place] 48 hours in order for your provider to thoroughly review all the results before contacting the office for clarification of your results.     Thank you for choosing me and [facility].  Vito Cirigliano, D.O.

## 2022-06-21 NOTE — Progress Notes (Signed)
Chief Complaint: Discuss colon cancer screening, APC gene mutation, family history of colon cancer   Referring Provider:    Faith Rogue T   HPI:     Brandi Henderson is a 41 y.o. female with medical history as below, referred to the Gastroenterology Clinic for evaluation of initiation of early colon cancer screening due to family history and APC gene mutation.  She is otherwise without GI symptoms.  No hematochezia, melena, abdominal pain, change in bowel habits.  Good p.o. intake and preserved appetite.  No previous EGD or colonoscopy.   Family history notable for the following: - Mother: APC gene mutation called p.I1307K.  Per [facility], this is a moderate risk mutation. Colonoscopy with precancerous polyps - Brother: Crohn's disease - MGF: Non-Hodgkin's lymphoma - Maternal great grandfather: Colon cancer in his 42s  Genetic testing completed 04/26/2022 and also notable for familial mutation in APC called p.I1307K and another variant of uncertain significance in the APC gene called  c.7475C>T .  She was then seen in follow-up in the [facility] on 05/03/2022.  The p.I1307K variant in the APC gene carries a colon cancer lifetime risk of 5-10% with recommendation to start screening at age 32 and screening every 5 years, or 10 years prior if the individual has a first-degree relative with CRC.     Latest Ref Rng & Units 09/16/2020    2:09 PM 03/14/2016    6:10 AM 03/13/2016    7:57 AM  CBC  WBC 4.0 - 10.5 K/uL 5.3  9.7  8.4   Hemoglobin 12.0 - 15.0 g/dL 14.6  12.8  13.6   Hematocrit 36.0 - 46.0 % 42.1  37.8  39.8   Platelets 150.0 - 400.0 K/uL 225.0  193  204       Latest Ref Rng & Units 09/16/2020    2:09 PM 03/23/2014    9:22 AM  CMP  Glucose 70 - 99 mg/dL 93  91   BUN 6 - 23 mg/dL 11  10   Creatinine 0.40 - 1.20 mg/dL 0.80  0.66   Sodium 135 - 145 mEq/L 138  138   Potassium 3.5 - 5.1 mEq/L 3.9  4.6   Chloride 96 - 112 mEq/L 106  103   CO2  19 - 32 mEq/L 27  25   Calcium 8.4 - 10.5 mg/dL 9.5  9.2   Total Protein 6.0 - 8.3 g/dL 6.6  6.6   Total Bilirubin 0.2 - 1.2 mg/dL 0.5  0.8   Alkaline Phos 39 - 117 U/L 45  93   AST 0 - 37 U/L 19  18   ALT 0 - 35 U/L 17  15       Past Medical History:  Diagnosis Date   Allergy    Erythema nodosum 2001   with OCPs and Mononucleosis prior   Hx of varicella    IUD (intrauterine device) in place 2014   copper    LGA (large for gestational age) fetus affecting mother, antepartum 03/13/2016   LGA (large for gestational age) fetus affecting mother, antepartum 03/13/2016   Meralgia paresthetica of right side 2015   Postpartum care following vaginal delivery (7/25) 03/14/2016   Routine gynecological examination    Dr. Orvan Seen, [facility]   SVD (spontaneous vaginal delivery) 03/13/2016   Wears glasses      Past Surgical History:  Procedure Laterality Date   CYST REMOVAL HAND  ganglion, right   WISDOM TOOTH EXTRACTION     Family History  Problem Relation Age of Onset   Other Mother        APC moderate risk mutation   Macular degeneration Father    Crohn's disease Brother    Brain cancer Paternal Uncle 50       glioblastoma   Non-Hodgkin's lymphoma Maternal Grandfather    Diabetes Paternal Grandmother    Heart disease Paternal Grandmother    Macular degeneration Paternal Grandmother    Stroke Neg Hx    Hypertension Neg Hx    Hyperlipidemia Neg Hx    Social History   Tobacco Use   Smoking status: Never   Smokeless tobacco: Never  Vaping Use   Vaping Use: Never used  Substance Use Topics   Alcohol use: Yes    Alcohol/week: 2.0 standard drinks of alcohol    Types: 1 Glasses of wine, 1 Cans of beer per week   Drug use: No   Current Outpatient Medications  Medication Sig Dispense Refill   buPROPion (WELLBUTRIN XL) 300 MG 24 hr tablet Take 1 tablet (300 mg total) by mouth daily. 90 tablet 0   Multiple Vitamin (MULTIVITAMIN) tablet Take 1 tablet by mouth  daily.     TART CHERRY PO Take by mouth.     TURMERIC PO Take by mouth.     VITAMIN D PO Take by mouth daily.     No current facility-administered medications for this visit.   Allergies  Allergen Reactions   Estrogens     Birth control pills, erythema nodosum     Review of Systems: All systems reviewed and negative except where noted in HPI.     Physical Exam:    Wt Readings from Last 3 Encounters:  06/21/22 236 lb 9.6 oz (107.3 kg)  03/16/22 232 lb 3.2 oz (105.3 kg)  12/13/21 225 lb (102.1 kg)    BP 130/88   Pulse 81   Ht 6' (1.829 m)   Wt 236 lb 9.6 oz (107.3 kg)   SpO2 97%   BMI 32.09 kg/m  Constitutional:  Pleasant, in no acute distress. Psychiatric: Normal mood and affect. Behavior is normal. Cardiovascular: Normal rate, regular rhythm. No edema Pulmonary/chest: Effort normal and breath sounds normal. No wheezing, rales or rhonchi. Abdominal: Soft, nondistended, nontender. Bowel sounds active throughout. There are no masses palpable. No hepatomegaly. Neurological: Alert and oriented to person place and time. Skin: Skin is warm and dry. No rashes noted.   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  Otherwise no GI symptoms.  Discussed societal guideline recommendations in patients with APC gene mutation.  Strong data to support early CRC screening given increased risk of 5-10% lifetime for CRC. - Schedule colonoscopy.  Interval for continued screening/surveillance will be largely based on findings at her index colonoscopy - No known family history of small bowel, gastric, or pancreatic cancer to recommend screening for those diseases at this juncture.  If colonoscopy notable for multiple  polyps suggestive of either FAP or attenuated FAP, can consider upper endoscopy for additional screening. - Encourage first-degree relatives to undergo genetic screening as well (after age 23)  The indications, risks, and benefits of colonoscopy were explained  to the patient in detail. Risks include but are not limited to bleeding, perforation, adverse reaction to medications, and cardiopulmonary compromise. Sequelae include but are not limited to the possibility of surgery, hospitalization, and mortality. The patient verbalized understanding and wished to proceed. All questions answered, referred to the scheduler and bowel prep ordered. Further recommendations pending results of the exam.     Lavena Bullion, DO, FACG  06/21/2022, 8:22 AM   Allwardt, Randa Evens, PA-C

## 2022-06-28 ENCOUNTER — Ambulatory Visit: Payer: 59 | Admitting: Psychology

## 2022-07-20 ENCOUNTER — Encounter: Payer: Self-pay | Admitting: Physician Assistant

## 2022-07-20 ENCOUNTER — Ambulatory Visit: Payer: 59 | Admitting: Physician Assistant

## 2022-07-20 VITALS — BP 126/82 | HR 82 | Temp 97.5°F | Ht 72.0 in | Wt 239.8 lb

## 2022-07-20 DIAGNOSIS — R040 Epistaxis: Secondary | ICD-10-CM

## 2022-07-20 NOTE — Patient Instructions (Addendum)
I think your nosebleed is an aggravated vessel in the left nare.  Use the A[place] Saline nasal gel three to four times daily. Humidifier in room. Lots of fluids.  If > 20 minutes, need to seek urgent medical care.

## 2022-07-20 NOTE — Progress Notes (Signed)
Subjective:    Patient ID: Brandi Henderson, female    DOB: [birth year], 41 y.o.   MRN: [id number]  Chief Complaint  Patient presents with   Epistaxis    Pt having sudden onset of nosebleeds; pt states started mid day Wednesday and another one next morning and then that evening. Pt c/o having them daily; takes up to 10 mins or more to stop bleeding.     Epistaxis    Patient is in today for nosebleeds from left nare x 2 days. No hx of nosebleeds. Notes only mild congestion, but not blowing nose, no recent sickness.   4 total so far. One at work, one upon waking up at home, another one during a meeting, this morning blew nose and bleeding.  Doing Vaseline in her nose. No nasal sprays.  Has to pinch and lean forward, lasts about 10 minutes.   Past Medical History:  Diagnosis Date   Allergy    Erythema nodosum 2001   with OCPs and Mononucleosis prior   Hx of varicella    IUD (intrauterine device) in place 2014   copper    LGA (large for gestational age) fetus affecting mother, antepartum 03/13/2016   LGA (large for gestational age) fetus affecting mother, antepartum 03/13/2016   Meralgia paresthetica of right side 2015   Postpartum care following vaginal delivery (7/25) 03/14/2016   Routine gynecological examination    Dr. Orvan Seen, [facility]   SVD (spontaneous vaginal delivery) 03/13/2016   Wears glasses     Past Surgical History:  Procedure Laterality Date   CYST REMOVAL HAND     ganglion, right   WISDOM TOOTH EXTRACTION      Family History  Problem Relation Age of Onset   Other Mother        APC moderate risk mutation   Macular degeneration Father    Crohn's disease Brother    Brain cancer Paternal Uncle 65       glioblastoma   Non-Hodgkin's lymphoma Maternal Grandfather    Diabetes Paternal Grandmother    Heart disease Paternal Grandmother    Macular degeneration Paternal Grandmother    Stroke Neg Hx    Hypertension Neg Hx     Hyperlipidemia Neg Hx     Social History   Tobacco Use   Smoking status: Never   Smokeless tobacco: Never  Vaping Use   Vaping Use: Never used  Substance Use Topics   Alcohol use: Yes    Alcohol/week: 2.0 standard drinks of alcohol    Types: 1 Glasses of wine, 1 Cans of beer per week   Drug use: No     Allergies  Allergen Reactions   Estrogens     Birth control pills, erythema nodosum    Review of Systems  HENT:  Positive for nosebleeds.    NEGATIVE UNLESS OTHERWISE INDICATED IN HPI      Objective:     BP 126/82 (BP Location: Right Arm)   Pulse 82   Temp (!) 97.5 F (36.4 C) (Temporal)   Ht 6' (1.829 m)   Wt 239 lb 12.8 oz (108.8 kg)   LMP 06/28/2022 (Exact Date)   SpO2 97%   BMI 32.52 kg/m   Wt Readings from Last 3 Encounters:  07/20/22 239 lb 12.8 oz (108.8 kg)  06/21/22 236 lb 9.6 oz (107.3 kg)  03/16/22 232 lb 3.2 oz (105.3 kg)    BP Readings from Last 3 Encounters:  07/20/22 126/82  06/21/22 130/88  03/16/22  112/78     Physical Exam Vitals and nursing note reviewed.  Constitutional:      Appearance: Normal appearance.  HENT:     Right Ear: Tympanic membrane, ear canal and external ear normal.     Left Ear: Tympanic membrane, ear canal and external ear normal.     Nose: No congestion.     Right Nostril: No epistaxis.     Left Nostril: No epistaxis.     Right Turbinates: Not enlarged.     Left Turbinates: Not enlarged.     Right Sinus: No maxillary sinus tenderness.     Left Sinus: No maxillary sinus tenderness.     Comments: Lateral wall left nare there is scabbing, crusting from prior bleeds as described. No active bleeding.  Cardiovascular:     Rate and Rhythm: Normal rate and regular rhythm.     Pulses: Normal pulses.     Heart sounds: No murmur heard. Pulmonary:     Effort: Pulmonary effort is normal.     Breath sounds: Normal breath sounds.  Neurological:     Mental Status: She is alert.        Assessment & Plan:   Epistaxis  I think your nosebleed is an aggravated vessel in the left nare.  Use the [place] Saline nasal gel three to four times daily. Humidifier in room. Lots of fluids.  If > 20 minutes, need to seek urgent medical care.   ENT referral if persistent / recurrent.    Return if symptoms worsen or fail to improve.  This note was prepared with assistance of Systems analyst. Occasional wrong-word or sound-a-like substitutions may have occurred due to the inherent limitations of voice recognition software.      Ivett Luebbe M Homero Hyson, PA-C

## 2022-07-26 ENCOUNTER — Ambulatory Visit: Payer: 59 | Admitting: Psychology

## 2022-07-30 ENCOUNTER — Ambulatory Visit: Payer: 59 | Admitting: Physician Assistant

## 2022-08-09 ENCOUNTER — Ambulatory Visit: Payer: 59 | Admitting: Psychology

## 2022-08-23 ENCOUNTER — Ambulatory Visit: Payer: 59 | Admitting: Psychology

## 2022-08-24 ENCOUNTER — Ambulatory Visit (AMBULATORY_SURGERY_CENTER): Payer: BC Managed Care – PPO | Admitting: Gastroenterology

## 2022-08-24 ENCOUNTER — Encounter: Payer: Self-pay | Admitting: Gastroenterology

## 2022-08-24 VITALS — BP 107/68 | HR 64 | Temp 98.6°F | Resp 13 | Ht 72.0 in | Wt 236.0 lb

## 2022-08-24 DIAGNOSIS — K635 Polyp of colon: Secondary | ICD-10-CM

## 2022-08-24 DIAGNOSIS — Z83719 Family history of colon polyps, unspecified: Secondary | ICD-10-CM

## 2022-08-24 DIAGNOSIS — Z800 Family history of malignant neoplasm of digestive organs: Secondary | ICD-10-CM | POA: Diagnosis not present

## 2022-08-24 DIAGNOSIS — Z1589 Genetic susceptibility to other disease: Secondary | ICD-10-CM | POA: Diagnosis not present

## 2022-08-24 DIAGNOSIS — D120 Benign neoplasm of cecum: Secondary | ICD-10-CM

## 2022-08-24 DIAGNOSIS — Z1211 Encounter for screening for malignant neoplasm of colon: Secondary | ICD-10-CM

## 2022-08-24 DIAGNOSIS — D122 Benign neoplasm of ascending colon: Secondary | ICD-10-CM

## 2022-08-24 MED ORDER — SODIUM CHLORIDE 0.9 % IV SOLN: 500.0000 mL | Freq: Once | INTRAVENOUS | Status: DC

## 2022-08-24 NOTE — Progress Notes (Signed)
Pt's states no medical or surgical changes since previsit or office visit. VS assessed by C.W 

## 2022-08-24 NOTE — Patient Instructions (Addendum)
  Patient has a contact number available for emergencies. The signs and symptoms of potential delayed complications were discussed with the patient. Return to normal activities tomorrow. Written discharge instructions were provided to the patient. - Resume previous diet. - Continue present medications. - Await pathology results. - Repeat colonoscopy in 3 - 5 years for surveillance based on pathology results. - Return to GI office PRN.   YOU HAD AN ENDOSCOPIC PROCEDURE TODAY AT [facility]:   Refer to the procedure report that was given to you for any specific questions about what was found during the examination.  If the procedure report does not answer your questions, please call your gastroenterologist to clarify.  If you requested that your care partner not be given the details of your procedure findings, then the procedure report has been included in a sealed envelope for you to review at your convenience later.  YOU SHOULD EXPECT: Some feelings of bloating in the abdomen. Passage of more gas than usual.  Walking can help get rid of the air that was put into your GI tract during the procedure and reduce the bloating. If you had a lower endoscopy (such as a colonoscopy or flexible sigmoidoscopy) you may notice spotting of blood in your stool or on the toilet paper. If you underwent a bowel prep for your procedure, you may not have a normal bowel movement for a few days.  Please Note:  You might notice some irritation and congestion in your nose or some drainage.  This is from the oxygen used during your procedure.  There is no need for concern and it should clear up in a day or so.  SYMPTOMS TO REPORT IMMEDIATELY:  Following lower endoscopy (colonoscopy or flexible sigmoidoscopy):  Excessive amounts of blood in the stool  Significant tenderness or worsening of abdominal pains  Swelling of the abdomen that is new, acute  Fever of 100F or higher   For urgent or emergent  issues, a gastroenterologist can be reached at any hour by calling [phone]. Do not use MyChart messaging for urgent concerns.    DIET:  We do recommend a small meal at first, but then you may proceed to your regular diet.  Drink plenty of fluids but you should avoid alcoholic beverages for 24 hours.  ACTIVITY:  You should plan to take it easy for the rest of today and you should NOT DRIVE or use heavy machinery until tomorrow (because of the sedation medicines used during the test).    FOLLOW UP: Our staff will call the number listed on your records the next business day following your procedure.  We will call around 7:15- 8:00 am to check on you and address any questions or concerns that you may have regarding the information given to you following your procedure. If we do not reach you, we will leave a message.     If any biopsies were taken you will be contacted by phone or by letter within the next 1-3 weeks.  Please call us at [phone] if you have not heard about the biopsies in 3 weeks.    SIGNATURES/CONFIDENTIALITY: You and/or your care partner have signed paperwork which will be entered into your electronic medical record.  These signatures attest to the fact that that the information above on your After Visit Summary has been reviewed and is understood.  Full responsibility of the confidentiality of this discharge information lies with you and/or your care-partner.

## 2022-08-24 NOTE — Progress Notes (Signed)
Report to PACU, RN, vss, BBS= Clear.  

## 2022-08-24 NOTE — Op Note (Signed)
[place] Patient Name: Brandi Henderson Procedure Date: 08/24/2022 8:06 AM MRN: [id number] Endoscopist: Gerrit Heck , MD, [id number] Age: 42 Referring MD:  Date of Birth: Jun 30, 1981 Gender: Female Account #: [id number] Procedure:                Colonoscopy Indications:              Screening for colon cancer: Family history of                            colorectal cancer, colon polyps and Crohns Disease                           Genetic mutuation with increased risk for CRC                           42 y.o. 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 Per guideline recommendations,                            to start screening at age 2 and continued                            screening every 5 years. Medicines:                Monitored Anesthesia Care Procedure:                Pre-Anesthesia Assessment:                           - Prior to the procedure, a History and Physical                            was performed, and patient medications and                            allergies were reviewed. The patient's tolerance of                            previous anesthesia was also reviewed. The risks                            and benefits of the procedure and the sedation  options and risks were discussed with the patient.                            All questions were answered, and informed consent                            was obtained. Prior Anticoagulants: The patient has                            taken no anticoagulant or antiplatelet agents. ASA                            Grade Assessment: II - A patient with mild systemic                            disease. After reviewing the  risks and benefits,                            the patient was deemed in satisfactory condition to                            undergo the procedure.                           After obtaining informed consent, the colonoscope                            was passed under direct vision. Throughout the                            procedure, the patient's blood pressure, pulse, and                            oxygen saturations were monitored continuously. The                            CF [id number] #[id number] was introduced through the anus                            and advanced to the the cecum, identified by                            appendiceal orifice and ileocecal valve. The                            colonoscopy was performed without difficulty. The                            patient tolerated the procedure well. The quality                            of the bowel preparation was good. The ileocecal  valve, appendiceal orifice, and rectum were                            photographed. Scope In: 8:36:29 AM Scope Out: 8:59:58 AM Scope Withdrawal Time: 0 hours 21 minutes 0 seconds  Total Procedure Duration: 0 hours 23 minutes 29 seconds  Findings:                 The perianal and digital rectal examinations were                            normal.                           A 5 mm polyp was found in the cecum. The polyp was                            semi-sessile. The polyp was removed with a cold                            snare. Resection and retrieval were complete.                            Estimated blood loss was minimal.                           A 12 mm polyp was found in the ascending colon. The                            polyp was semi-sessile. The polyp was removed with                            a cold snare. Resection and retrieval were                            complete. Estimated blood loss was minimal.                           The exam was otherwise  normal throughout the                            remainder of the colon.                           The retroflexed view of the distal rectum and anal                            verge was normal and showed no anal or rectal                            abnormalities. Complications:            No immediate complications. Estimated Blood Loss:     Estimated blood loss was minimal. Impression:               - One 5  mm polyp in the cecum, removed with a cold                            snare. Resected and retrieved.                           - One 12 mm polyp in the ascending colon, removed                            with a cold snare. Resected and retrieved.                           - The distal rectum and anal verge are normal on                            retroflexion view. Recommendation:           - Patient has a contact number available for                            emergencies. The signs and symptoms of potential                            delayed complications were discussed with the                            patient. Return to normal activities tomorrow.                            Written discharge instructions were provided to the                            patient.                           - Resume previous diet.                           - Continue present medications.                           - Await pathology results.                           - Repeat colonoscopy in 3 - 5 years for                            surveillance based on pathology results.                           - Return to GI office PRN. Gerrit Heck, MD 08/24/2022 9:05:29 AM

## 2022-08-24 NOTE — Progress Notes (Signed)
GASTROENTEROLOGY PROCEDURE H&P NOTE   Primary Care Physician: Allwardt, Randa Evens, PA-C    Reason for Procedure:  Colon cancer screening  Plan:    Colonoscopy  Patient is appropriate for endoscopic procedure(s) in the ambulatory ([place]) setting.  The nature of the procedure, as well as the risks, benefits, and alternatives were carefully and thoroughly reviewed with the patient. Ample time for discussion and questions allowed. The patient understood, was satisfied, and agreed to proceed.     HPI: Brandi Henderson is a 42 y.o. 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  Per guideline recommendations, to start screening at age 59 and continued screening every 5 years.  Otherwise no GI symptoms.  Past Medical History:  Diagnosis Date   Allergy    Erythema nodosum 2001   with OCPs and Mononucleosis prior   Hx of varicella    IUD (intrauterine device) in place 2014   copper    LGA (large for gestational age) fetus affecting mother, antepartum 03/13/2016   LGA (large for gestational age) fetus affecting mother, antepartum 03/13/2016   Meralgia paresthetica of right side 2015   Postpartum care following vaginal delivery (7/25) 03/14/2016   Routine gynecological examination    Dr. Orvan Seen, [facility]   SVD (spontaneous vaginal delivery) 03/13/2016   Wears glasses     Past Surgical History:  Procedure Laterality Date   CYST REMOVAL HAND     ganglion, right   WISDOM TOOTH EXTRACTION      Prior to Admission medications   Medication Sig Start Date End Date Taking? Authorizing Provider  buPROPion (WELLBUTRIN XL) 300 MG 24 hr tablet Take 1 tablet (300 mg total) by mouth daily. 05/16/22   Allwardt, Randa Evens, PA-C  busPIRone (BUSPAR) 15 MG tablet Take 15 mg by mouth 3 (three) times daily. 06/23/22   [provider]  Multiple Vitamin (MULTIVITAMIN) tablet Take 1 tablet by mouth daily.    [provider]  TART CHERRY PO Take by mouth. Patient not taking: Reported on 07/20/2022    [provider]  TURMERIC PO Take by mouth.    [provider]  VITAMIN D PO Take by mouth daily.    [provider]    Current Outpatient Medications  Medication Sig Dispense Refill   buPROPion (WELLBUTRIN XL) 300 MG 24 hr tablet Take 1 tablet (300 mg total) by mouth daily. 90 tablet 0   busPIRone (BUSPAR) 15 MG tablet Take 15 mg by mouth 3 (three) times daily.     Multiple Vitamin (MULTIVITAMIN) tablet Take 1 tablet by mouth daily.     TART CHERRY PO Take by mouth. (Patient not taking: Reported on 07/20/2022)     TURMERIC PO Take by mouth.     VITAMIN D PO Take by mouth daily.     No current facility-administered medications for this visit.    Allergies as of 08/24/2022 - Review Complete 08/24/2022  Allergen Reaction Noted   Estrogens  03/23/2014    Family History  Problem Relation Age of Onset   Other Mother        APC moderate risk mutation   Macular degeneration Father    Crohn's disease Brother    Brain cancer Paternal Uncle 29       glioblastoma   Non-Hodgkin's lymphoma Maternal Grandfather    Diabetes Paternal Grandmother  Heart disease Paternal Grandmother    Macular degeneration Paternal Grandmother    Stroke Neg Hx    Hypertension Neg Hx    Hyperlipidemia Neg Hx     Social History   Socioeconomic History   Marital status: Married    Spouse name: Not on file   Number of children: 2   Years of education: Not on file   Highest education level: Master's degree (e.g., MA, MS, MEng, MEd, MSW, MBA)  Occupational History   Not on file  Tobacco Use   Smoking status: Never   Smokeless tobacco: Never  Vaping Use   Vaping Use: Never used  Substance and Sexual Activity   Alcohol use: Yes    Alcohol/week: 2.0 standard drinks of alcohol    Types: 1  Glasses of wine, 1 Cans of beer per week   Drug use: No   Sexual activity: Yes    Birth control/protection: I.U.D.  Other Topics Concern   Not on file  Social History Narrative   Exercises 4 days per week with walking, running, biking.  Married, has 32moson as of 8/15.   [place]  Teaches history at the [employer]   Social Determinants of Health   Financial Resource Strain: Unknown (08/08/2021)   Overall Financial Resource Strain (CARDIA)    Difficulty of Paying Living Expenses: Patient refused  Food Insecurity: Unknown (08/08/2021)   Hunger Vital Sign    Worried About Running Out of Food in the Last Year: Patient refused    R[place] the Last Year: Patient refused  Transportation Needs: No Transportation Needs (08/08/2021)   PRAPARE - THydrologist(Medical): No    Lack of Transportation (Non-Medical): No  Physical Activity: Sufficiently Active (08/08/2021)   Exercise Vital Sign    Days of Exercise per Week: 5 days    Minutes of Exercise per Session: 40 min  Stress: Stress Concern Present (08/08/2021)   [place]   Feeling of Stress : Rather much  Social Connections: Unknown (08/08/2021)   Social Connection and Isolation Panel [NHANES]    Frequency of Communication with Friends and Family: Patient refused    Frequency of Social Gatherings with Friends and Family: Patient refused    Attends Religious Services: Patient refused    AMarine scientistor Organizations: Yes    Attends CArchivistMeetings: Patient refused    Marital Status: Patient refused  Intimate Partner Violence: Not on file    Physical Exam: Vital signs in last 24 hours: '@There'$  were no vitals taken for this visit. GEN: NAD EYE: Sclerae anicteric ENT: MMM CV: Non-tachycardic Pulm: CTA b/l GI: Soft, NT/ND NEURO:  Alert & Oriented x 3   VGerrit Heck  DO [facility]   08/24/2022 8:09 AM

## 2022-08-24 NOTE — Progress Notes (Signed)
Called to room to assist during endoscopic procedure.  Patient ID and intended procedure confirmed with present staff. Received instructions for my participation in the procedure from the performing physician.  

## 2022-08-27 ENCOUNTER — Telehealth: Payer: Self-pay | Admitting: *Deleted

## 2022-08-27 NOTE — Telephone Encounter (Signed)
  Follow up Call-     08/24/2022    8:20 AM  Call back number  Post procedure Call Back phone  # [phone]  Permission to leave phone message Yes     Patient questions:  Do you have a fever, pain , or abdominal swelling? No. Pain Score  0 *  Have you tolerated food without any problems? Yes.    Have you been able to return to your normal activities? Yes.    Do you have any questions about your discharge instructions: Diet   No. Medications  No. Follow up visit  No.  Do you have questions or concerns about your Care? No.  Actions: * If pain score is 4 or above: No action needed, pain <4.

## 2022-08-28 ENCOUNTER — Other Ambulatory Visit: Payer: Self-pay

## 2022-08-28 MED ORDER — BUPROPION HCL ER (XL) 300 MG PO TB24: 300.0000 mg | ORAL_TABLET | Freq: Every day | ORAL | 0 refills | Status: DC

## 2022-09-05 ENCOUNTER — Encounter: Payer: Self-pay | Admitting: Gastroenterology

## 2022-09-06 ENCOUNTER — Ambulatory Visit: Payer: 59 | Admitting: Psychology

## 2022-09-21 ENCOUNTER — Ambulatory Visit (INDEPENDENT_AMBULATORY_CARE_PROVIDER_SITE_OTHER): Payer: BC Managed Care – PPO | Admitting: Physician Assistant

## 2022-09-21 ENCOUNTER — Encounter: Payer: Self-pay | Admitting: Physician Assistant

## 2022-09-21 VITALS — BP 120/80 | HR 85 | Temp 97.5°F | Ht 72.0 in | Wt 242.8 lb

## 2022-09-21 DIAGNOSIS — E6609 Other obesity due to excess calories: Secondary | ICD-10-CM

## 2022-09-21 DIAGNOSIS — Z6832 Body mass index (BMI) 32.0-32.9, adult: Secondary | ICD-10-CM | POA: Diagnosis not present

## 2022-09-21 DIAGNOSIS — Z0000 Encounter for general adult medical examination without abnormal findings: Secondary | ICD-10-CM | POA: Diagnosis not present

## 2022-09-21 LAB — COMPREHENSIVE METABOLIC PANEL
ALT: 30 U/L (ref 0–35)
AST: 22 U/L (ref 0–37)
Albumin: 4.5 g/dL (ref 3.5–5.2)
Alkaline Phosphatase: 55 U/L (ref 39–117)
BUN: 14 mg/dL (ref 6–23)
CO2: 25 mEq/L (ref 19–32)
Calcium: 9.2 mg/dL (ref 8.4–10.5)
Chloride: 105 mEq/L (ref 96–112)
Creatinine, Ser: 0.77 mg/dL (ref 0.40–1.20)
GFR: 96.31 mL/min (ref >60.00)
Glucose, Bld: 113 mg/dL — ABNORMAL HIGH (ref 70–99)
Potassium: 4.2 mEq/L (ref 3.5–5.1)
Sodium: 138 mEq/L (ref 135–145)
Total Bilirubin: 0.6 mg/dL (ref 0.2–1.2)
Total Protein: 6.9 g/dL (ref 6.0–8.3)

## 2022-09-21 LAB — TSH: TSH: 2.23 u[IU]/mL (ref 0.35–5.50)

## 2022-09-21 LAB — VITAMIN D 25 HYDROXY (VIT D DEFICIENCY, FRACTURES): VITD: 28.96 ng/mL — ABNORMAL LOW (ref 30.00–100.00)

## 2022-09-21 LAB — CBC WITH DIFFERENTIAL/PLATELET
Basophils Absolute: 0 10*3/uL (ref 0.0–0.1)
Basophils Relative: 0.3 % (ref 0.0–3.0)
Eosinophils Absolute: 0.2 10*3/uL (ref 0.0–0.7)
Eosinophils Relative: 3.8 % (ref 0.0–5.0)
HCT: 42.7 % (ref 36.0–46.0)
Hemoglobin: 14.8 g/dL (ref 12.0–15.0)
Lymphocytes Relative: 23.7 % (ref 12.0–46.0)
Lymphs Abs: 1.3 10*3/uL (ref 0.7–4.0)
MCHC: 34.7 g/dL (ref 30.0–36.0)
MCV: 91.8 fl (ref 78.0–100.0)
Monocytes Absolute: 0.6 10*3/uL (ref 0.1–1.0)
Monocytes Relative: 11.4 % (ref 3.0–12.0)
Neutro Abs: 3.3 10*3/uL (ref 1.4–7.7)
Neutrophils Relative %: 60.8 % (ref 43.0–77.0)
Platelets: 256 10*3/uL (ref 150.0–400.0)
RBC: 4.65 Mil/uL (ref 3.87–5.11)
RDW: 12.2 % (ref 11.5–15.5)
WBC: 5.4 10*3/uL (ref 4.0–10.5)

## 2022-09-21 LAB — LIPID PANEL
Cholesterol: 183 mg/dL (ref 0–200)
HDL: 56.1 mg/dL (ref >39.00)
LDL Cholesterol: 111 mg/dL — ABNORMAL HIGH (ref 0–99)
NonHDL: 126.73
Total CHOL/HDL Ratio: 3
Triglycerides: 79 mg/dL (ref 0.0–149.0)
VLDL: 15.8 mg/dL (ref 0.0–40.0)

## 2022-09-21 LAB — VITAMIN B12: Vitamin B-12: 363 pg/mL (ref 211–911)

## 2022-09-21 MED ORDER — TIRZEPATIDE-WEIGHT MANAGEMENT 2.5 MG/0.5ML ~~LOC~~ SOAJ: 2.5000 mg | SUBCUTANEOUS | 0 refills | Status: DC

## 2022-09-21 NOTE — Progress Notes (Signed)
Subjective:    Patient ID: Brandi Henderson, female    DOB: 1981/04/25, 42 y.o.   MRN: [id number]  Chief Complaint  Patient presents with   Annual Exam    Pt in office for annual CPE and fasting labs; pt haas no concerns otherwise;     HPI Patient is in today for annual exam.  Acute concerns: Struggling with weight loss despite regular exercise regimen and healthy nutrition; says this is the heaviest she has been and can't lose  Health maintenance: Lifestyle/ exercise: 3-4 times per week - Peloton, walking  Nutrition: Doing well  Mental health: Doing good  Sleep: Doing good  Substance use: None  Sexual activity: Monogamous, no STIs  Immunizations: UTD  Colonoscopy: Jan 2024 - genetic predisposition, polyps, will come back in 3-5 years  Pap: Dr. Benjie Karvonen - appt upcoming  Mammogram: Will address with Dr. Benjie Karvonen     Past Medical History:  Diagnosis Date   Allergy    Erythema nodosum 2001   with OCPs and Mononucleosis prior   Hx of varicella    IUD (intrauterine device) in place 2014   copper    LGA (large for gestational age) fetus affecting mother, antepartum 03/13/2016   LGA (large for gestational age) fetus affecting mother, antepartum 03/13/2016   Meralgia paresthetica of right side 2015   Postpartum care following vaginal delivery (7/25) 03/14/2016   Routine gynecological examination    Dr. Orvan Seen, [facility]   SVD (spontaneous vaginal delivery) 03/13/2016   Wears glasses     Past Surgical History:  Procedure Laterality Date   CYST REMOVAL HAND     ganglion, right   WISDOM TOOTH EXTRACTION      Family History  Problem Relation Age of Onset   Other Mother        APC moderate risk mutation   Macular degeneration Father    Crohn's disease Brother    Brain cancer Paternal Uncle 20       glioblastoma   Non-Hodgkin's lymphoma Maternal Grandfather    Diabetes Paternal Grandmother    Heart disease Paternal Grandmother    Macular degeneration  Paternal Grandmother    Stroke Neg Hx    Hypertension Neg Hx    Hyperlipidemia Neg Hx     Social History   Tobacco Use   Smoking status: Never   Smokeless tobacco: Never  Vaping Use   Vaping Use: Never used  Substance Use Topics   Alcohol use: Yes    Alcohol/week: 2.0 standard drinks of alcohol    Types: 1 Glasses of wine, 1 Cans of beer per week   Drug use: No     Allergies  Allergen Reactions   Estrogens     Birth control pills, erythema nodosum    Review of Systems NEGATIVE UNLESS OTHERWISE INDICATED IN HPI      Objective:     BP 120/80 (BP Location: Left Arm)   Pulse 85   Temp (!) 97.5 F (36.4 C) (Temporal)   Ht 6' (1.829 m)   Wt 242 lb 12.8 oz (110.1 kg)   LMP 09/06/2022 (Exact Date)   SpO2 98%   BMI 32.93 kg/m   Wt Readings from Last 3 Encounters:  09/21/22 242 lb 12.8 oz (110.1 kg)  08/24/22 236 lb (107 kg)  07/20/22 239 lb 12.8 oz (108.8 kg)    BP Readings from Last 3 Encounters:  09/21/22 120/80  08/24/22 107/68  07/20/22 126/82     Physical Exam Vitals  and nursing note reviewed.  Constitutional:      Appearance: Normal appearance. She is obese. She is not toxic-appearing.  HENT:     Head: Normocephalic and atraumatic.     Right Ear: External ear normal.     Left Ear: External ear normal.     Nose: Nose normal.     Mouth/Throat:     Mouth: Mucous membranes are moist.  Eyes:     Extraocular Movements: Extraocular movements intact.     Conjunctiva/sclera: Conjunctivae normal.     Pupils: Pupils are equal, round, and reactive to light.  Cardiovascular:     Rate and Rhythm: Normal rate and regular rhythm.     Pulses: Normal pulses.     Heart sounds: Normal heart sounds.  Pulmonary:     Effort: Pulmonary effort is normal.     Breath sounds: Normal breath sounds.  Musculoskeletal:        General: Normal range of motion.     Cervical back: Normal range of motion and neck supple.  Skin:    General: Skin is warm and dry.      Findings: No lesion or rash.  Neurological:     General: No focal deficit present.     Mental Status: She is alert and oriented to person, place, and time.  Psychiatric:        Mood and Affect: Mood normal.        Behavior: Behavior normal.        Assessment & Plan:  Encounter for annual physical exam -     CBC with Differential/Platelet -     Comprehensive metabolic panel -     Lipid panel -     TSH -     Vitamin B12 -     VITAMIN D 25 Hydroxy (Vit-D Deficiency, Fractures)  Other orders -     Tirzepatide-Weight Management; Inject 2.5 mg into the skin once a week.  Dispense: 2 mL; Refill: 0    Age-appropriate screening and counseling performed today. Will check labs and call with results. Preventive measures discussed and printed in AVS for patient.   Patient Counseling: '[x]'$   Nutrition: Stressed importance of moderation in sodium/caffeine intake, saturated fat and cholesterol, caloric balance, sufficient intake of fresh fruits, vegetables, and fiber.  '[x]'$   Stressed the importance of regular exercise.   '[]'$   Substance Abuse: Discussed cessation/primary prevention of tobacco, alcohol, or other drug use; driving or other dangerous activities under the influence; availability of treatment for abuse.   '[]'$   Injury prevention: Discussed safety belts, safety helmets, smoke detector, smoking near bedding or upholstery.   '[]'$   Sexuality: Discussed sexually transmitted diseases, partner selection, use of condoms, avoidance of unintended pregnancy  and contraceptive alternatives.   '[x]'$   Dental health: Discussed importance of regular tooth brushing, flossing, and dental visits.  '[x]'$   Health maintenance and immunizations reviewed. Please refer to Health maintenance section.     Qualifies for Zepbound as BMI >30 and failing to lose weight despite strong efforts of exercise and nutrition at home. Pt aware of risks vs benefits and possible adverse reactions. Will try to get medication approved.  Sent Rx for her today. Will f/up in 1 month if able to start this medication, otherwise f/up in 1 year.     Return in about 1 year (around 09/22/2023) for CPE, labs.    Courney Garrod M Carnell Beavers, PA-C

## 2022-09-25 DIAGNOSIS — R509 Fever, unspecified: Secondary | ICD-10-CM | POA: Diagnosis not present

## 2022-10-02 ENCOUNTER — Telehealth: Payer: Self-pay

## 2022-10-02 NOTE — Telephone Encounter (Signed)
PA for Zepbound denied as this is not a covered benefit per patient plan. Denial scanned into pt chart.  Reference # [facility] [id number] 09/26/22

## 2022-10-17 ENCOUNTER — Other Ambulatory Visit: Payer: Self-pay

## 2022-10-17 MED ORDER — TIRZEPATIDE-WEIGHT MANAGEMENT 2.5 MG/0.5ML ~~LOC~~ SOAJ: 2.5000 mg | SUBCUTANEOUS | 0 refills | Status: AC

## 2022-10-19 ENCOUNTER — Ambulatory Visit (INDEPENDENT_AMBULATORY_CARE_PROVIDER_SITE_OTHER): Payer: BC Managed Care – PPO | Admitting: Physician Assistant

## 2022-10-19 ENCOUNTER — Other Ambulatory Visit: Payer: Self-pay | Admitting: Physician Assistant

## 2022-10-19 ENCOUNTER — Encounter: Payer: Self-pay | Admitting: Physician Assistant

## 2022-10-19 VITALS — BP 110/78 | HR 88 | Temp 98.0°F | Ht 72.0 in | Wt 227.4 lb

## 2022-10-19 DIAGNOSIS — E6609 Other obesity due to excess calories: Secondary | ICD-10-CM | POA: Diagnosis not present

## 2022-10-19 DIAGNOSIS — Z6830 Body mass index (BMI) 30.0-30.9, adult: Secondary | ICD-10-CM | POA: Diagnosis not present

## 2022-10-19 NOTE — Progress Notes (Signed)
Subjective:    Patient ID: Brandi Henderson, female    DOB: 05/15/1981, 42 y.o.   MRN: [id number]  Chief Complaint  Patient presents with   Follow-up    Pt in office for 1 mon f/u; pt states all is well and no concerns to discuss today    HPI Patient is in today for 1 mo f/up on weight.   Even with coupon card, she paid $550 for a month's worth of Zepbound.  She lost 15 lbs with the 2.5 mg once weekly dosing. Doing well with Peloton, walking, nutrition.  She's not sure if her insurance participates with any other wt loss medications.    Past Medical History:  Diagnosis Date   Allergy    Erythema nodosum 2001   with OCPs and Mononucleosis prior   Hx of varicella    IUD (intrauterine device) in place 2014   copper    LGA (large for gestational age) fetus affecting mother, antepartum 03/13/2016   LGA (large for gestational age) fetus affecting mother, antepartum 03/13/2016   Meralgia paresthetica of right side 2015   Postpartum care following vaginal delivery (7/25) 03/14/2016   Routine gynecological examination    Dr. Orvan Seen, [facility]   SVD (spontaneous vaginal delivery) 03/13/2016   Wears glasses     Past Surgical History:  Procedure Laterality Date   CYST REMOVAL HAND     ganglion, right   WISDOM TOOTH EXTRACTION      Family History  Problem Relation Age of Onset   Other Mother        APC moderate risk mutation   Macular degeneration Father    Crohn's disease Brother    Brain cancer Paternal Uncle 47       glioblastoma   Non-Hodgkin's lymphoma Maternal Grandfather    Diabetes Paternal Grandmother    Heart disease Paternal Grandmother    Macular degeneration Paternal Grandmother    Stroke Neg Hx    Hypertension Neg Hx    Hyperlipidemia Neg Hx     Social History   Tobacco Use   Smoking status: Never   Smokeless tobacco: Never  Vaping Use   Vaping Use: Never used  Substance Use Topics   Alcohol use: Yes    Alcohol/week: 2.0  standard drinks of alcohol    Types: 1 Glasses of wine, 1 Cans of beer per week   Drug use: No     Allergies  Allergen Reactions   Estrogens     Birth control pills, erythema nodosum    Review of Systems NEGATIVE UNLESS OTHERWISE INDICATED IN HPI      Objective:     BP 110/78 (BP Location: Left Arm)   Pulse 88   Temp 98 F (36.7 C) (Temporal)   Ht 6' (1.829 m)   Wt 227 lb 6.4 oz (103.1 kg)   LMP 09/29/2022 (Approximate)   SpO2 99%   BMI 30.84 kg/m   Wt Readings from Last 3 Encounters:  10/19/22 227 lb 6.4 oz (103.1 kg)  09/21/22 242 lb 12.8 oz (110.1 kg)  08/24/22 236 lb (107 kg)    BP Readings from Last 3 Encounters:  10/19/22 110/78  09/21/22 120/80  08/24/22 107/68     Physical Exam Vitals and nursing note reviewed.  Constitutional:      Appearance: Normal appearance. She is obese.  Cardiovascular:     Rate and Rhythm: Normal rate and regular rhythm.  Pulmonary:     Effort: Pulmonary effort is normal.  Neurological:     General: No focal deficit present.     Mental Status: She is alert and oriented to person, place, and time.  Psychiatric:        Mood and Affect: Mood normal.        Assessment & Plan:  Class 1 obesity due to excess calories without serious comorbidity with body mass index (BMI) of 30.0 to 30.9 in adult  Completed one month of Zepbound 2.5 mg once weekly; lost 15 lbs, minimal side effects. Doing well with exercise and nutrition regimen. Cannot afford $550 every month for medication. I will consult with our Zepbound rep; she will also reach out to her insurance and see about other options.    Return if symptoms worsen or fail to improve.    Sevrin Sally M Erasto Sleight, PA-C

## 2022-11-13 ENCOUNTER — Ambulatory Visit (INDEPENDENT_AMBULATORY_CARE_PROVIDER_SITE_OTHER): Payer: BC Managed Care – PPO | Admitting: Physician Assistant

## 2022-11-13 VITALS — BP 114/80 | HR 95 | Temp 97.8°F | Ht 72.0 in | Wt 228.8 lb

## 2022-11-13 DIAGNOSIS — E6609 Other obesity due to excess calories: Secondary | ICD-10-CM

## 2022-11-13 DIAGNOSIS — Z6831 Body mass index (BMI) 31.0-31.9, adult: Secondary | ICD-10-CM | POA: Diagnosis not present

## 2022-11-13 DIAGNOSIS — E66811 Obesity, class 1: Secondary | ICD-10-CM | POA: Insufficient documentation

## 2022-11-13 MED ORDER — ZEPBOUND 5 MG/0.5ML ~~LOC~~ SOAJ: 5.0000 mg | SUBCUTANEOUS | 2 refills | Status: AC

## 2022-11-13 NOTE — Progress Notes (Signed)
Subjective:    Patient ID: Brandi Henderson, female    DOB: 1980/09/20, 42 y.o.   MRN: [id number]  Chief Complaint  Patient presents with   Follow-up    Pt in for f/u to discuss weight loss options, Zepbound rep has no there options for assistance and pt advised last visit can no longer afford 550.00 for injections. Pt states today the savings card is now working for her and only paying 350.00 OOP for injecton. Pt is happy with Zepbound and still working for her.     HPI Patient is in today for recheck on weight / Zepbound. She's done 2 months of Zepbound so far, currently on 2.5 mg once weekly. Taking it at night. 15 lb loss so far. Very happy with results. Paying OOP for medication. States she has been exercising regularly and doing well with diet balance.   Past Medical History:  Diagnosis Date   Allergy    Erythema nodosum 2001   with OCPs and Mononucleosis prior   Hx of varicella    IUD (intrauterine device) in place 2014   copper    LGA (large for gestational age) fetus affecting mother, antepartum 03/13/2016   LGA (large for gestational age) fetus affecting mother, antepartum 03/13/2016   Meralgia paresthetica of right side 2015   Postpartum care following vaginal delivery (7/25) 03/14/2016   Routine gynecological examination    Dr. Orvan Seen, [facility]   SVD (spontaneous vaginal delivery) 03/13/2016   Wears glasses     Past Surgical History:  Procedure Laterality Date   CYST REMOVAL HAND     ganglion, right   WISDOM TOOTH EXTRACTION      Family History  Problem Relation Age of Onset   Other Mother        APC moderate risk mutation   Macular degeneration Father    Crohn's disease Brother    Brain cancer Paternal Uncle 37       glioblastoma   Non-Hodgkin's lymphoma Maternal Grandfather    Diabetes Paternal Grandmother    Heart disease Paternal Grandmother    Macular degeneration Paternal Grandmother    Stroke Neg Hx    Hypertension Neg Hx     Hyperlipidemia Neg Hx     Social History   Tobacco Use   Smoking status: Never   Smokeless tobacco: Never  Vaping Use   Vaping Use: Never used  Substance Use Topics   Alcohol use: Yes    Alcohol/week: 2.0 standard drinks of alcohol    Types: 1 Glasses of wine, 1 Cans of beer per week   Drug use: No     Allergies  Allergen Reactions   Estrogens     Birth control pills, erythema nodosum    Review of Systems NEGATIVE UNLESS OTHERWISE INDICATED IN HPI      Objective:     BP 114/80 (BP Location: Left Arm)   Pulse 95   Temp 97.8 F (36.6 C) (Temporal)   Ht 6' (1.829 m)   Wt 228 lb 12.8 oz (103.8 kg)   LMP 10/23/2022 (Exact Date)   SpO2 98%   BMI 31.03 kg/m   Wt Readings from Last 3 Encounters:  11/13/22 228 lb 12.8 oz (103.8 kg)  10/19/22 227 lb 6.4 oz (103.1 kg)  09/21/22 242 lb 12.8 oz (110.1 kg)    BP Readings from Last 3 Encounters:  11/13/22 114/80  10/19/22 110/78  09/21/22 120/80     Physical Exam Vitals and nursing note reviewed.  Constitutional:      Appearance: Normal appearance. She is obese.  Cardiovascular:     Rate and Rhythm: Normal rate and regular rhythm.  Pulmonary:     Effort: Pulmonary effort is normal.  Neurological:     General: No focal deficit present.     Mental Status: She is alert and oriented to person, place, and time.  Psychiatric:        Mood and Affect: Mood normal.        Assessment & Plan:  Class 1 obesity due to excess calories without serious comorbidity with body mass index (BMI) of 31.0 to 31.9 in adult Assessment & Plan: Pt doing well with lifestyle changes. She is doing well with Zepbound as well. Will increase dose to 5 mg once weekly. Offered [facility] referral, she declines at this time. Will call with updates / for dose changes.    Other orders -     Zepbound; Inject 5 mg into the skin once a week.  Dispense: 2 mL; Refill: 2        Return in about 3 months (around 02/13/2023) for weight / med  check .    Prince Olivier M Sharian Delia, PA-C

## 2022-11-13 NOTE — Assessment & Plan Note (Signed)
Pt doing well with lifestyle changes. She is doing well with Zepbound as well. Will increase dose to 5 mg once weekly. Offered [facility] referral, she declines at this time. Will call with updates / for dose changes.

## 2023-01-15 DIAGNOSIS — Z01419 Encounter for gynecological examination (general) (routine) without abnormal findings: Secondary | ICD-10-CM | POA: Diagnosis not present

## 2023-01-15 DIAGNOSIS — Z124 Encounter for screening for malignant neoplasm of cervix: Secondary | ICD-10-CM | POA: Diagnosis not present

## 2023-01-15 DIAGNOSIS — Z1231 Encounter for screening mammogram for malignant neoplasm of breast: Secondary | ICD-10-CM | POA: Diagnosis not present

## 2023-01-15 LAB — HM MAMMOGRAPHY

## 2023-01-18 ENCOUNTER — Other Ambulatory Visit (HOSPITAL_COMMUNITY): Payer: Self-pay

## 2023-01-18 ENCOUNTER — Encounter: Payer: Self-pay | Admitting: Physician Assistant

## 2023-01-18 MED ORDER — TIRZEPATIDE-WEIGHT MANAGEMENT 5 MG/0.5ML ~~LOC~~ SOAJ
5.0000 mg | SUBCUTANEOUS | 2 refills | Status: DC
  Filled 2023-01-18: qty 2, 28d supply, fill #0

## 2023-01-21 ENCOUNTER — Other Ambulatory Visit (HOSPITAL_COMMUNITY): Payer: Self-pay

## 2023-01-24 LAB — HM PAP SMEAR: HPV, high-risk: NEGATIVE

## 2023-01-31 ENCOUNTER — Other Ambulatory Visit: Payer: Self-pay | Admitting: Physician Assistant

## 2023-02-01 ENCOUNTER — Other Ambulatory Visit (HOSPITAL_COMMUNITY): Payer: Self-pay

## 2023-02-01 MED ORDER — ZEPBOUND 5 MG/0.5ML ~~LOC~~ SOAJ
5.0000 mg | SUBCUTANEOUS | 2 refills | Status: DC
  Filled 2023-02-01 – 2023-02-13 (×3): qty 2, 28d supply, fill #0
  Filled 2023-03-15: qty 2, 28d supply, fill #1
  Filled 2023-04-22: qty 2, 28d supply, fill #2

## 2023-02-06 ENCOUNTER — Other Ambulatory Visit (HOSPITAL_COMMUNITY): Payer: Self-pay

## 2023-02-13 ENCOUNTER — Other Ambulatory Visit (HOSPITAL_COMMUNITY): Payer: Self-pay

## 2023-02-13 ENCOUNTER — Other Ambulatory Visit: Payer: Self-pay

## 2023-02-20 ENCOUNTER — Other Ambulatory Visit (HOSPITAL_COMMUNITY): Payer: Self-pay

## 2023-03-15 ENCOUNTER — Other Ambulatory Visit: Payer: Self-pay

## 2023-03-20 ENCOUNTER — Other Ambulatory Visit (HOSPITAL_COMMUNITY): Payer: Self-pay

## 2023-04-21 ENCOUNTER — Other Ambulatory Visit: Payer: Self-pay | Admitting: Physician Assistant

## 2023-04-22 ENCOUNTER — Other Ambulatory Visit (HOSPITAL_COMMUNITY): Payer: Self-pay

## 2023-04-24 ENCOUNTER — Other Ambulatory Visit: Payer: Self-pay

## 2023-04-24 MED ORDER — BUPROPION HCL ER (XL) 300 MG PO TB24: 300.0000 mg | ORAL_TABLET | Freq: Every day | ORAL | 0 refills | Status: DC

## 2023-05-02 ENCOUNTER — Other Ambulatory Visit (HOSPITAL_COMMUNITY): Payer: Self-pay

## 2023-07-25 DIAGNOSIS — F4323 Adjustment disorder with mixed anxiety and depressed mood: Secondary | ICD-10-CM | POA: Diagnosis not present

## 2023-08-06 DIAGNOSIS — F4323 Adjustment disorder with mixed anxiety and depressed mood: Secondary | ICD-10-CM | POA: Diagnosis not present

## 2023-08-19 ENCOUNTER — Ambulatory Visit: Payer: BC Managed Care – PPO | Admitting: Physician Assistant

## 2023-08-23 ENCOUNTER — Other Ambulatory Visit: Payer: Self-pay | Admitting: Physician Assistant

## 2023-08-27 DIAGNOSIS — F4323 Adjustment disorder with mixed anxiety and depressed mood: Secondary | ICD-10-CM | POA: Diagnosis not present

## 2023-09-04 ENCOUNTER — Ambulatory Visit: Payer: BC Managed Care – PPO | Admitting: Physician Assistant

## 2023-09-04 ENCOUNTER — Encounter: Payer: Self-pay | Admitting: Physician Assistant

## 2023-09-04 VITALS — BP 128/84 | HR 84 | Temp 98.1°F | Ht 72.0 in | Wt 195.4 lb

## 2023-09-04 DIAGNOSIS — Z23 Encounter for immunization: Secondary | ICD-10-CM | POA: Diagnosis not present

## 2023-09-04 DIAGNOSIS — G479 Sleep disorder, unspecified: Secondary | ICD-10-CM | POA: Diagnosis not present

## 2023-09-04 DIAGNOSIS — Z8379 Family history of other diseases of the digestive system: Secondary | ICD-10-CM

## 2023-09-04 DIAGNOSIS — F419 Anxiety disorder, unspecified: Secondary | ICD-10-CM | POA: Diagnosis not present

## 2023-09-04 DIAGNOSIS — Z7689 Persons encountering health services in other specified circumstances: Secondary | ICD-10-CM

## 2023-09-04 MED ORDER — HYDROXYZINE HCL 25 MG PO TABS: 25.0000 mg | ORAL_TABLET | Freq: Every evening | ORAL | 0 refills | Status: DC | PRN

## 2023-09-04 NOTE — Progress Notes (Signed)
 Patient ID: Brandi Henderson, female    DOB: [birth year], 43 y.o.   MRN: [id number]   Assessment & Plan:  Encounter for weight management  Sleep disturbances  Anxiety  Family history of celiac disease  Immunization due -     Tdap vaccine greater than or equal to 7yo IM  Other orders -     hydrOXYzine  HCl; Take 1 tablet (25 mg total) by mouth at bedtime as needed for anxiety (insomnia). Take one 25 mg tablet 30-60 minutes prior to bedtime for insomnia, anxiety. May increase to two tablets.  Dispense: 15 tablet; Refill: 0     Assessment and Plan    Weight Management Discontinued Zepbound  due to side effects including hair loss and loss of appetite. Reports feeling better and more comfortable with her body since discontinuing the medication. -Continue current exercise regimen and maintain a healthy diet.  Anxiety Stable on Wellbutrin  300mg  daily and Buspar  30mg  daily (10mg  in the morning, 20mg  at night). -Continue current medication regimen.  Sleep Disturbance Reports difficulty falling asleep and staying asleep, with some nights of waking up in the middle of the night. -Trial of Hydroxyzine  25mg  as needed for sleep disturbances. -Consider lifestyle modifications such as exposure to sunlight during the day.  Celiac Disease Son recently diagnosed with celiac disease. Patient has switched to a gluten-free diet but has not been tested for celiac disease herself. -Consider celiac panel if patient reintroduces gluten into her diet for a month.    General Health Maintenance -Annual physical exam in 1 year. -Reach out if labs or other needs arise before then.         Return in about 1 year (around 09/03/2024) for recheck/follow-up.    Subjective:    Chief Complaint  Patient presents with   Medication Refill    Pt in office for medication check; refills not needed at this time but was told by pharmacy needed a visit for refills; pt not sleeping well in  the past 6 months has noticed a change in sleep patterns;     HPI Discussed the use of AI scribe software for clinical note transcription with the patient, who gave verbal consent to proceed.  History of Present Illness   The patient, with a history of mental health issues managed with Wellbutrin  and Buspar , presents for a routine check-up. She reports feeling good overall, with regular exercise of Pilates and cycling on a Peloton. She has recently stopped taking Zepbound  after achieving a comfortable weight loss, although the exact amount is unknown as the patient avoids weighing herself for mental health reasons.  The patient reports experiencing sleep issues, with irregular sleep patterns and frequent awakenings during the night. She often feels wide awake in the middle of the night and has difficulty falling back asleep. During these periods, she experiences anxious thoughts. She has been taking melatonin to aid sleep but is unsure of its effectiveness.  The patient's oldest son was recently diagnosed with celiac disease. The patient, along with her family, has switched to a gluten-free diet. She is considering getting tested for celiac disease, although she does not report any symptoms associated with the condition.       Past Medical History:  Diagnosis Date   Allergy    Erythema nodosum 2001   with OCPs and Mononucleosis prior   Hx of varicella    IUD (intrauterine device) in place 2014   copper    LGA (large for gestational age) fetus affecting mother,  antepartum 03/13/2016   LGA (large for gestational age) fetus affecting mother, antepartum 03/13/2016   Meralgia paresthetica of right side 2015   Postpartum care following vaginal delivery (7/25) 03/14/2016   Routine gynecological examination    Dr. Neila Bally, [facility]   SVD (spontaneous vaginal delivery) 03/13/2016   Wears glasses     Past Surgical History:  Procedure Laterality Date   CYST REMOVAL HAND     ganglion,  right   WISDOM TOOTH EXTRACTION      Family History  Problem Relation Age of Onset   Other Mother        APC moderate risk mutation   Macular degeneration Father    Crohn's disease Brother    Non-Hodgkin's lymphoma Maternal Grandfather    Diabetes Paternal Grandmother    Heart disease Paternal Grandmother    Macular degeneration Paternal Grandmother    Celiac disease Son    Brain cancer Paternal Uncle 79       glioblastoma   Stroke Neg Hx    Hypertension Neg Hx    Hyperlipidemia Neg Hx     Social History   Tobacco Use   Smoking status: Never   Smokeless tobacco: Never  Vaping Use   Vaping status: Never Used  Substance Use Topics   Alcohol use: Yes    Alcohol/week: 2.0 standard drinks of alcohol    Types: 1 Glasses of wine, 1 Cans of beer per week   Drug use: No     Allergies  Allergen Reactions   Estrogens Other (See Comments)    Birth control pills, erythema nodosum    Review of Systems NEGATIVE UNLESS OTHERWISE INDICATED IN HPI      Objective:     BP 128/84 (BP Location: Left Arm, Patient Position: Sitting, Cuff Size: Normal)   Pulse 84   Temp 98.1 F (36.7 C) (Temporal)   Ht 6' (1.829 m)   Wt 195 lb 6.4 oz (88.6 kg)   LMP 08/22/2023 (Approximate)   SpO2 99%   BMI 26.50 kg/m   Wt Readings from Last 3 Encounters:  09/04/23 195 lb 6.4 oz (88.6 kg)  11/13/22 228 lb 12.8 oz (103.8 kg)  10/19/22 227 lb 6.4 oz (103.1 kg)    BP Readings from Last 3 Encounters:  09/04/23 128/84  11/13/22 114/80  10/19/22 110/78     Physical Exam Vitals and nursing note reviewed.  Constitutional:      Appearance: Normal appearance. She is normal weight. She is not toxic-appearing.  HENT:     Head: Normocephalic and atraumatic.     Right Ear: External ear normal.     Left Ear: External ear normal.  Eyes:     Extraocular Movements: Extraocular movements intact.     Conjunctiva/sclera: Conjunctivae normal.     Pupils: Pupils are equal, round, and reactive to  light.  Cardiovascular:     Rate and Rhythm: Normal rate and regular rhythm.     Pulses: Normal pulses.     Heart sounds: Normal heart sounds.  Pulmonary:     Effort: Pulmonary effort is normal.     Breath sounds: Normal breath sounds.  Musculoskeletal:        General: Normal range of motion.     Cervical back: Normal range of motion and neck supple.  Skin:    General: Skin is warm and dry.  Neurological:     General: No focal deficit present.     Mental Status: She is alert and oriented to  person, place, and time.  Psychiatric:        Mood and Affect: Mood normal.        Behavior: Behavior normal.        Steen Bisig M Troyce Febo, PA-C

## 2023-09-05 DIAGNOSIS — R051 Acute cough: Secondary | ICD-10-CM | POA: Diagnosis not present

## 2023-09-05 DIAGNOSIS — B349 Viral infection, unspecified: Secondary | ICD-10-CM | POA: Diagnosis not present

## 2023-09-05 DIAGNOSIS — R52 Pain, unspecified: Secondary | ICD-10-CM | POA: Diagnosis not present

## 2023-09-05 DIAGNOSIS — R509 Fever, unspecified: Secondary | ICD-10-CM | POA: Diagnosis not present

## 2023-09-10 DIAGNOSIS — F4323 Adjustment disorder with mixed anxiety and depressed mood: Secondary | ICD-10-CM | POA: Diagnosis not present

## 2023-09-19 DIAGNOSIS — J029 Acute pharyngitis, unspecified: Secondary | ICD-10-CM | POA: Diagnosis not present

## 2023-10-01 DIAGNOSIS — F4323 Adjustment disorder with mixed anxiety and depressed mood: Secondary | ICD-10-CM | POA: Diagnosis not present

## 2023-10-20 ENCOUNTER — Other Ambulatory Visit: Payer: Self-pay | Admitting: Physician Assistant

## 2023-10-22 DIAGNOSIS — F4323 Adjustment disorder with mixed anxiety and depressed mood: Secondary | ICD-10-CM | POA: Diagnosis not present

## 2023-11-11 DIAGNOSIS — M79644 Pain in right finger(s): Secondary | ICD-10-CM | POA: Diagnosis not present

## 2023-11-11 DIAGNOSIS — S61310A Laceration without foreign body of right index finger with damage to nail, initial encounter: Secondary | ICD-10-CM | POA: Diagnosis not present

## 2023-11-16 ENCOUNTER — Other Ambulatory Visit: Payer: Self-pay | Admitting: Physician Assistant

## 2023-11-18 ENCOUNTER — Other Ambulatory Visit: Payer: Self-pay | Admitting: Physician Assistant

## 2023-11-18 DIAGNOSIS — S61310D Laceration without foreign body of right index finger with damage to nail, subsequent encounter: Secondary | ICD-10-CM | POA: Diagnosis not present

## 2023-11-19 DIAGNOSIS — F4323 Adjustment disorder with mixed anxiety and depressed mood: Secondary | ICD-10-CM | POA: Diagnosis not present

## 2023-11-21 DIAGNOSIS — S61310D Laceration without foreign body of right index finger with damage to nail, subsequent encounter: Secondary | ICD-10-CM | POA: Diagnosis not present

## 2023-11-28 DIAGNOSIS — S61310D Laceration without foreign body of right index finger with damage to nail, subsequent encounter: Secondary | ICD-10-CM | POA: Diagnosis not present

## 2023-12-10 DIAGNOSIS — F4323 Adjustment disorder with mixed anxiety and depressed mood: Secondary | ICD-10-CM | POA: Diagnosis not present

## 2023-12-12 DIAGNOSIS — M79644 Pain in right finger(s): Secondary | ICD-10-CM | POA: Diagnosis not present

## 2023-12-12 DIAGNOSIS — S61310A Laceration without foreign body of right index finger with damage to nail, initial encounter: Secondary | ICD-10-CM | POA: Diagnosis not present

## 2023-12-24 DIAGNOSIS — F411 Generalized anxiety disorder: Secondary | ICD-10-CM | POA: Diagnosis not present

## 2024-01-09 DIAGNOSIS — S61310A Laceration without foreign body of right index finger with damage to nail, initial encounter: Secondary | ICD-10-CM | POA: Diagnosis not present

## 2024-01-14 DIAGNOSIS — F411 Generalized anxiety disorder: Secondary | ICD-10-CM | POA: Diagnosis not present

## 2024-01-21 DIAGNOSIS — Z01419 Encounter for gynecological examination (general) (routine) without abnormal findings: Secondary | ICD-10-CM | POA: Diagnosis not present

## 2024-01-21 DIAGNOSIS — Z1331 Encounter for screening for depression: Secondary | ICD-10-CM | POA: Diagnosis not present

## 2024-01-21 DIAGNOSIS — Z1231 Encounter for screening mammogram for malignant neoplasm of breast: Secondary | ICD-10-CM | POA: Diagnosis not present

## 2024-01-27 DIAGNOSIS — F411 Generalized anxiety disorder: Secondary | ICD-10-CM | POA: Diagnosis not present

## 2024-01-30 ENCOUNTER — Encounter: Payer: Self-pay | Admitting: Physician Assistant

## 2024-01-31 ENCOUNTER — Other Ambulatory Visit: Payer: Self-pay | Admitting: *Deleted

## 2024-01-31 DIAGNOSIS — Z8379 Family history of other diseases of the digestive system: Secondary | ICD-10-CM

## 2024-02-16 ENCOUNTER — Other Ambulatory Visit: Payer: Self-pay | Admitting: Physician Assistant

## 2024-02-19 DIAGNOSIS — F411 Generalized anxiety disorder: Secondary | ICD-10-CM | POA: Diagnosis not present

## 2024-02-28 ENCOUNTER — Other Ambulatory Visit: Payer: Self-pay

## 2024-02-28 DIAGNOSIS — Z8379 Family history of other diseases of the digestive system: Secondary | ICD-10-CM

## 2024-03-02 ENCOUNTER — Other Ambulatory Visit

## 2024-03-02 DIAGNOSIS — Z8379 Family history of other diseases of the digestive system: Secondary | ICD-10-CM | POA: Diagnosis not present

## 2024-03-03 LAB — GLIADIN ANTIBODIES, SERUM
Gliadin IgA: <1 U/mL
Gliadin IgG: <1 U/mL

## 2024-03-03 LAB — TISSUE TRANSGLUTAMINASE, IGA: (tTG) Ab, IgA: <1 U/mL

## 2024-03-04 ENCOUNTER — Ambulatory Visit: Payer: Self-pay | Admitting: Physician Assistant

## 2024-03-04 DIAGNOSIS — F411 Generalized anxiety disorder: Secondary | ICD-10-CM | POA: Diagnosis not present

## 2024-03-04 LAB — RETICULIN ANTIBODIES, IGA W TITER: Reticulin Ab, IgA: NEGATIVE {titer} (ref <2.5)

## 2024-03-17 DIAGNOSIS — F411 Generalized anxiety disorder: Secondary | ICD-10-CM | POA: Diagnosis not present

## 2024-04-07 DIAGNOSIS — F411 Generalized anxiety disorder: Secondary | ICD-10-CM | POA: Diagnosis not present

## 2024-04-22 DIAGNOSIS — F411 Generalized anxiety disorder: Secondary | ICD-10-CM | POA: Diagnosis not present

## 2024-05-14 DIAGNOSIS — F411 Generalized anxiety disorder: Secondary | ICD-10-CM | POA: Diagnosis not present

## 2024-05-17 ENCOUNTER — Other Ambulatory Visit: Payer: Self-pay | Admitting: Physician Assistant

## 2024-05-24 ENCOUNTER — Encounter: Payer: Self-pay | Admitting: Physician Assistant

## 2024-05-25 ENCOUNTER — Other Ambulatory Visit: Payer: Self-pay | Admitting: Physician Assistant

## 2024-05-25 MED ORDER — ONDANSETRON 4 MG PO TBDP
4.0000 mg | ORAL_TABLET | Freq: Three times a day (TID) | ORAL | 0 refills | Status: AC | PRN
Start: 2024-05-25 — End: ?

## 2024-07-21 DIAGNOSIS — F411 Generalized anxiety disorder: Secondary | ICD-10-CM | POA: Diagnosis not present

## 2024-08-16 ENCOUNTER — Other Ambulatory Visit: Payer: Self-pay | Admitting: Physician Assistant

## 2024-09-25 ENCOUNTER — Other Ambulatory Visit: Payer: Self-pay | Admitting: Physician Assistant

## 2024-10-22 ENCOUNTER — Encounter: Admitting: Physician Assistant
